# Patient Record
Sex: Male | Born: 1945 | Race: White | Hispanic: No | Marital: Married
Health system: Southern US, Community
[De-identification: ages and names within clinical notes are randomized; demographics above are authoritative.]

---

## 2004-10-06 ENCOUNTER — Emergency Department (HOSPITAL_COMMUNITY): Admission: EM | Admit: 2004-10-06 | Discharge: 2004-10-06 | Payer: Self-pay | Admitting: Emergency Medicine

## 2012-08-12 ENCOUNTER — Emergency Department: Payer: Self-pay | Admitting: Emergency Medicine

## 2012-08-12 LAB — COMPREHENSIVE METABOLIC PANEL
Albumin: 4.3 g/dL (ref 3.4–5.0)
Alkaline Phosphatase: 75 U/L (ref 50–136)
Anion Gap: 7 (ref 7–16)
Bilirubin,Total: 0.8 mg/dL (ref 0.2–1.0)
Chloride: 102 mmol/L (ref 98–107)
Co2: 31 mmol/L (ref 21–32)
Creatinine: 0.98 mg/dL (ref 0.60–1.30)
EGFR (African American): >60
Glucose: 113 mg/dL — ABNORMAL HIGH (ref 65–99)

## 2012-08-12 LAB — CBC
HCT: 47.7 % (ref 40.0–52.0)
HGB: 16 g/dL (ref 13.0–18.0)
MCH: 30.8 pg (ref 26.0–34.0)
MCHC: 33.5 g/dL (ref 32.0–36.0)
Platelet: 194 10*3/uL (ref 150–440)
RBC: 5.2 10*6/uL (ref 4.40–5.90)
RDW: 13.9 % (ref 11.5–14.5)

## 2012-08-12 LAB — URINALYSIS, COMPLETE
Bilirubin,UR: NEGATIVE
Glucose,UR: NEGATIVE mg/dL (ref 0–75)
Protein: NEGATIVE
RBC,UR: 4 /HPF (ref 0–5)
Specific Gravity: 1.026 (ref 1.003–1.030)

## 2012-09-24 ENCOUNTER — Inpatient Hospital Stay: Payer: Self-pay | Admitting: Surgery

## 2012-09-24 LAB — COMPREHENSIVE METABOLIC PANEL
Albumin: 4 g/dL (ref 3.4–5.0)
Anion Gap: 6 — ABNORMAL LOW (ref 7–16)
BUN: 16 mg/dL (ref 7–18)
Bilirubin,Total: 3 mg/dL — ABNORMAL HIGH (ref 0.2–1.0)
Creatinine: 0.84 mg/dL (ref 0.60–1.30)
EGFR (African American): >60
Glucose: 99 mg/dL (ref 65–99)
Osmolality: 279 (ref 275–301)
SGOT(AST): 161 U/L — ABNORMAL HIGH (ref 15–37)
Sodium: 139 mmol/L (ref 136–145)
Total Protein: 7.2 g/dL (ref 6.4–8.2)

## 2012-09-24 LAB — CBC
HCT: 43.7 % (ref 40.0–52.0)
HGB: 14.7 g/dL (ref 12.0–16.0)
MCH: 30.8 pg (ref 26.0–34.0)
MCV: 91 fL (ref 80–100)
Platelet: 154 10*3/uL (ref 150–440)
RDW: 13.6 % (ref 11.5–14.5)

## 2012-09-25 LAB — CBC WITH DIFFERENTIAL/PLATELET
Basophil %: 0.1 %
Eosinophil %: 0 %
HCT: 38.7 % — ABNORMAL LOW (ref 40.0–52.0)
Lymphocyte #: 0.7 10*3/uL — ABNORMAL LOW (ref 1.0–3.6)
Lymphocyte %: 4.9 %
MCHC: 33 g/dL (ref 32.0–36.0)
Monocyte %: 6.5 %
Neutrophil #: 12.3 10*3/uL — ABNORMAL HIGH (ref 1.4–6.5)

## 2012-09-25 LAB — COMPREHENSIVE METABOLIC PANEL
Albumin: 3 g/dL — ABNORMAL LOW (ref 3.4–5.0)
BUN: 17 mg/dL (ref 7–18)
Chloride: 104 mmol/L (ref 98–107)
Co2: 30 mmol/L (ref 21–32)
Creatinine: 1.06 mg/dL (ref 0.60–1.30)
Potassium: 3.3 mmol/L — ABNORMAL LOW (ref 3.5–5.1)
Sodium: 138 mmol/L (ref 136–145)

## 2012-09-26 LAB — CBC WITH DIFFERENTIAL/PLATELET
Basophil #: 0 10*3/uL (ref 0.0–0.1)
Basophil %: 0.3 %
Eosinophil #: 0 10*3/uL (ref 0.0–0.7)
HCT: 38.9 % — ABNORMAL LOW (ref 40.0–52.0)
HGB: 13.1 g/dL (ref 12.0–16.0)
Lymphocyte #: 0.8 10*3/uL — ABNORMAL LOW (ref 1.0–3.6)
MCHC: 33.6 g/dL (ref 32.0–36.0)
MCV: 92 fL (ref 80–100)
Monocyte #: 0.6 10*3/uL (ref 0.2–1.0)
Neutrophil %: 82 %
RBC: 4.24 10*6/uL — ABNORMAL LOW (ref 4.40–5.90)
RDW: 13.8 % (ref 11.5–14.5)

## 2012-09-26 LAB — PATHOLOGY REPORT

## 2014-08-28 DIAGNOSIS — G250 Essential tremor: Secondary | ICD-10-CM | POA: Diagnosis not present

## 2014-08-28 DIAGNOSIS — I10 Essential (primary) hypertension: Secondary | ICD-10-CM | POA: Diagnosis not present

## 2014-08-28 DIAGNOSIS — E039 Hypothyroidism, unspecified: Secondary | ICD-10-CM | POA: Diagnosis not present

## 2014-10-03 NOTE — Discharge Summary (Signed)
PATIENT NAME:  [name] [name], [name] [name] MR#:  [id number] DATE OF BIRTH:  06/29/1945  DATE OF ADMISSION:  09/24/2012 DATE OF DISCHARGE:  09/26/2012  DISCHARGE DIAGNOSES:  1.  Acute cholecystitis and choledocholithiasis.  2.  Hypertension.  3.  Hypothyroidism.   PRINCIPAL PROCEDURES: 1.  Magnetic resonance cholangiopancreatogram which was negative.  2.  Laparoscopic cholecystectomy with intraoperative cholangiography.   HOSPITAL COURSE SUMMARY: The patient was admitted with abdominal pain and signs and symptoms consistent with choledocholithiasis. He had elevated liver function tests. Total bilirubin was 3.0. An MRCP was performed and found to be unremarkable except for a dilated common bile duct at 7.3 mm. The patient had a laparoscopic cholecystectomy with intraoperative cholangiography on April 15th, and he tolerated this well. He had a large stone lodged in the gallbladder neck. Jackson-Pratt drain was left in place postoperatively. On postoperative day 1, the patient was doing well, tolerating a regular diet, had adequate pain control, and no bile was seen in the drain. He was, therefore, discharged home with followup with me on April 24th in the [place] office location. He was discharged with JP in place which will be removed in my office.  DISCHARGE MEDICATIONS: Acetaminophen/oxycodone 5/325, 1 tab every 4 to 6 hours as needed for pain, Synthroid 150 mcg by mouth once a day, hydrochlorothiazide/triamterene 25/37.5 mg tablet by mouth once a day.   DISCHARGE INSTRUCTIONS:  1.  Call with any questions or concerns.   2.  Diet as tolerated.  3.  Discharge instructions were provided to the patient.   ____________________________ Jeannette How. Marina Gravel, MD FACS mab:cb D: 10/09/2012 21:25:56 ET T: 10/09/2012 21:59:00 ET JOB#: [id number] cc: Elta Guadeloupe A. Marina Gravel, MD, <Dictator> Cyndi Bender, MD Pierson Vantol Bettina Gavia MD ELECTRONICALLY SIGNED 10/09/2012 22:17

## 2014-10-03 NOTE — Op Note (Signed)
PATIENT NAME:  [name] [name], [name] [name] MR#:  [id number] DATE OF BIRTH:  1946/03/05  DATE OF PROCEDURE:  09/25/2012  PREOPERATIVE DIAGNOSIS: Cholelithiasis and choledocholithiasis.   POSTOPERATIVE DIAGNOSIS: Cholelithiasis  and choledocholithiasis.    PROCEDURE PERFORMED: Laparoscopic cholecystectomy with intraoperative C-arm fluoroscopy and cholangiography.   SURGEON: Sherri Rad, MD   ASSISTANT: Scrub tech.  ANESTHESIA: General endotracheal.   ESTIMATED BLOOD LOSS: 50 mL.   DRAINS: None.   COUNTS:  LAP and needle count correct x 2.   FINDINGS: The gallbladder appeared to be mildly inflamed and edematous. The majority of the cholangiogram appeared to be unremarkable; however, due to the large nature of the gallbladder and a Kumar cholangiogram catheter being used, the duodenum could not be filled because it was backfilling into the gallbladder.   SPECIMENS: As described above.   DESCRIPTION OF PROCEDURE: With informed consent, supine position, general endotracheal anesthesia, the patient's abdomen was clipped of hair, prepped and draped with ChloraPrep solution. Timeout was observed. A 12 mm blunt Hassan trocar was placed through an infraumbilical transversely oriented skin incision with stay sutures being passed through the fascia. Pneumoperitoneum was established. The patient was then positioned in reverse Trendelenburg and airplaned right side up. Limited laparoscopic evaluation of the abdomen demonstrated normal-appearing stomach and liver. The peritoneum appeared normal. A 5 mm Bladeless trocar was placed in the epigastric region. Two 5-mm first assistant ports were then placed in the right subcostal margin. The gallbladder was aspirated of approximately 30 mL of dark bile. A grasper was placed on the fundus of the gallbladder and elevated towards the right shoulder. Lateral traction was placed on Hartmann's pouch. Dissection of the hepatoduodenal ligament demonstrated a cystic duct, which was  critically identified. The cystic artery with a posterior branch was identified and clipped in the usual fashion and divided. The gallbladder was very redundant in this  area of the Hartmann's pouch. The Kumar clamp was then placed across the base of the Hartmann's pouch. The gallbladder wall was cannulated with the Kumar needle. Fluoroscopy was then achieved. Filling of the cystic duct, common bile duct, right and left hepatic ducts was identified. There were no obvious filling defects.  The gallbladder, however, demonstrated some backfilling. Despite multiple attempts, I could not obtain an adequate seal. Given the fact that an MRCP done that morning was read as normal, I elected to abandon the rest of the cholangiography. The cystic duct was triply clipped on the portal side, singly clipped on the gallbladder side and divided. Further dissection in this area demonstrated no aberrant vessel or bile duct. The gallbladder was then carefully removed from the gallbladder fossa utilizing hook cautery apparatus, captured in an Endo Catch device, and retrieved through an infraumbilical port site. In retrieval of the gallbladder, and 5 mm camera was placed in the epigastric region. There was no evidence of bowel injury from the trocar placement site. With pneumoperitoneum re-established, the right upper quadrant was irrigated with a total of 1 liter of warm normal saline and aspirated dry.  Point hemostasis was obtained with both electrocautery and 10 mL of Surgicel with thrombin application. Ports were then removed under direct visualization. A total of 30 mL of 0.25% plain Marcaine was infiltrated along all skin and fascial incisions prior to closure; 4-0 Vicryl subcuticular was applied to all skin edges, followed by the application of benzoin, Steri-Strips, Telfa, and Tegaderm. The patient was then subsequently extubated and taken to the recovery room in stable and satisfactory condition by Anesthesia services.  ____________________________ Jeannette How Marina Gravel, MD mab:cb D: 09/26/2012 18:50:02 ET T: 09/26/2012 20:29:11 ET JOB#: [id number]  cc: Elta Guadeloupe A. Marina Gravel, MD, <Dictator> Cyndi Bender, MD at [phone] Delailah Spieth A Aldan Camey MD ELECTRONICALLY SIGNED 10/05/2012 9:31

## 2014-10-03 NOTE — H&P (Signed)
PATIENT NAME:  [name], [name] MR#:  [id number] DATE OF BIRTH:  08-May-1946  DATE OF ADMISSION:  09/24/2012  CONSULTING PHYSICIAN: Jerrol Banana. Burt Knack, MD   CHIEF COMPLAINT: Right upper quadrant pain.   HISTORY OF PRESENT ILLNESS: This is a patient who is having his second episode of right upper quadrant pain. It was associated with fatty food intolerance and started this morning after a fatty meal. He noticed diarrhea, then right upper quadrant pain and epigastric pain radiating through to his back with nausea, vomiting and some chills. He states he had shaking chills. Did not take his temperature.   He had an episode like this back in February. He came to the Emergency Room, but his pain abated and he went home without being seen or without having any additional testing, but this pain is not going away and I was asked to see the patient for evaluation. The patient denies jaundice or acholic stools and denies melena or hematochezia. No dysuria.   PAST MEDICAL HISTORY: Hypertension and hypothyroidism.   PAST SURGICAL HISTORY: Multiple inguinal hernia repairs with recurrences.   SOCIAL HISTORY: The patient does not smoke or drink. 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   ALLERGIES: SULFA DRUGS.   MEDICATIONS: Is not listed in chart, will reconcile when available.   FAMILY HISTORY: Noncontributory.   REVIEW OF SYSTEMS: A 10-system review was performed and negative with the exception of that mentioned in the history of present illness.   PHYSICAL EXAMINATION: GENERAL: Healthy, comfortable-appearing Caucasian male patient.   VITAL SIGNS: Temperature 98.5, pulse 84, respirations 20, blood pressure 105/63, pain scale currently 0 and 98% room air sat.  HEENT: No scleral icterus.  NECK: No palpable neck nodes.  CHEST: Clear to auscultation.  CARDIAC: Regular rate and rhythm.   ABDOMEN: Soft. There is a positive Murphy's sign, tender in the right upper quadrant and  epigastrium. Scars in both groins. No obvious hernia.  EXTREMITIES: Without edema.  NEUROLOGIC: Grossly intact.  INTEGUMENT:  Shows no jaundice.   LABORATORY, DIAGNOSTIC AND RADIOLOGIC DATA:  Ultrasound suggests sludge in the gallbladder.   White blood cell count 16.7, hemoglobin and hematocrit 14.7 and 44 and a platelet count of 154. Liver function tests are elevated with a bilirubin 3.0, AST and ALT of 161, 141, respectively, with alkaline phosphatase 69 and a lipase 82. Electrolytes are normal.   ASSESSMENT AND PLAN: This is a patient with likely choledocholithiasis. It is his second episode of right upper quadrant pain with his elevated liver function tests and the suggestion of choledocholithiasis. I have recommended admission to the hospital with hydration, IV antibiotics and re-examination. I discussed with him the repeating of labs in the morning and if his liver function tests are elevated or worsened, then we may consider a preoperative endoscopic cholangiopancreatography, which was described for him, versus a laparoscopic cholecystectomy with cholangiography with the risks of an open common duct exploration, conversion to an open procedure. The risks of bleeding, infection, recurrence of symptoms, failure to resolve the symptoms, conversion to an open procedure, bowel injury or bile duct injury or leak; any of which could require further surgery and/or endoscopic cholangiopancreatography stent and papillotomy were discussed with him. He will be admitted to the hospital, hydrated, IV antibiotics instituted and re-examined and Dr. Marina Gravel will make a decision about laparoscopic cholecystectomy versus preoperative or postoperative endoscopic cholangiopancreatography if necessary. The patient was in agreement with this plan.    ____________________________ Jerrol Banana. Burt Knack, MD rec:cc  D: 09/24/2012 22:00:26 ET T: 09/24/2012 22:51:48 ET JOB#: [id number]  cc: Jerrol Banana. Burt Knack, MD,  <Dictator> Florene Glen MD ELECTRONICALLY SIGNED 09/25/2012 6:38

## 2014-10-03 NOTE — H&P (Signed)
Subjective/Chief Complaint RUQ pain   History of Present Illness second episode RUQ and epig pain started this am after fatty meal nausea, emesis, diarrhea chills no jaundice   Past History HTN, hypothyroid PSH multiple inguinal hernia surgeries   Past Medical Health Hypertension   Past Med/Surgical Hx:  Hypothyroidism:   Hypertension:   ALLERGIES:  Sulfa drugs: Hives  Family and Social History:  Family History Non-Contributory   Social History negative tobacco, negative ETOH, Armed forces technical officer of [employer]   Review of Systems:  Fever/Chills Yes   Cough No   Abdominal Pain Yes   Diarrhea Yes   Constipation No   Nausea/Vomiting Yes   SOB/DOE No   Chest Pain No   Dysuria No   Tolerating Diet No  Nauseated  Vomiting   Physical Exam:  GEN no acute distress   HEENT pink conjunctivae   NECK supple   RESP normal resp effort  no use of accessory muscles   CARD regular rate   ABD positive tenderness  soft  pos Murphy's sign   LYMPH negative neck   EXTR negative edema   SKIN normal to palpation   PSYCH alert, A+O to time, place, person, good insight   Lab Results: Hepatic:  14-Apr-14 17:10   Bilirubin, Total  3.0  Alkaline Phosphatase 69  SGPT (ALT)  141  SGOT (AST)  161  Total Protein, Serum 7.2  Albumin, Serum 4.0  Routine Chem:  14-Apr-14 17:10   Glucose, Serum 99  BUN 16  Creatinine (comp) 0.84  Sodium, Serum 139  Potassium, Serum 3.7  Chloride, Serum 104  CO2, Serum 29  Calcium (Total), Serum 8.8  Osmolality (calc) 279  eGFR (African American) >60  eGFR (Non-African American) >60 (eGFR values <70m/min/1.73 m2 may be an indication of chronic kidney disease (CKD). Calculated eGFR is useful in patients with stable renal function. The eGFR calculation will not be reliable in acutely ill patients when serum creatinine is changing rapidly. It is not useful in  patients on dialysis. The eGFR calculation may not be  applicable to patients at the low and high extremes of body sizes, pregnant women, and vegetarians.)  Anion Gap  6  Result Comment hem/platelet - SMEAR SCANNED  Result(s) reported on 24 Sep 2012 at 05:55PM.  Lipase 82 (Result(s) reported on 24 Sep 2012 at 05:43PM.)  Routine Hem:  14-Apr-14 17:10   WBC (CBC)  16.7  RBC (CBC) 4.78  Hemoglobin (CBC) 14.7  Hematocrit (CBC) 43.7  Platelet Count (CBC) 154  MCV 91  MCH 30.8  MCHC 33.7  RDW 13.6   Radiology Results: [place]    14-Apr-14 19:33, [place]  [place] Survey  REASON FOR EXAM:    RUQ pain  COMMENTS:   Body Site: GB and Fossa, CBD, Head of Pancreas    PROCEDURE: [place] - [place] LIMITED SURVEY  - Sep 24 2012  7:33PM     RESULT: Right or quadrant abdominal ultrasound dated 09/24/2012.    Findings: The liver demonstrates a homogeneous echotexture. Hepato- pedal   flow is identified within the portal vein. There is no evidence of   ascites. Visualized portion of the pancreas unremarkable.    Dependent echogenic sludge versus very small mobile gallstones identified   within the gallbladder. The gallbladder wall measures 1.6 mm in   thickness. The common bile duct measures 7.6 mm in diameter. A positive   sonographic Murphy's sign is elicited. There is no  evidence of     pericholecystic fluid.    IMPRESSION:  Equivocal findings for cholecystitis. The early or mild   cholecystitis cannot be excluded alternatively passage of a calculus   through the system is also of diagnostic consideration. Clinical   correlation recommended and possibly surgical consultation if clinically   warranted.        Verified By: Mikki Santee, M.D., MD    Assessment/Admission Diagnosis choledocholithiasis rec admit hydrate IV abx recheck LFT LC with grams once able; may need ERCP risks options in detail see dictation   Electronic Signatures: Florene Glen (MD)  (Signed 14-Apr-14 21:54)  Authored: CHIEF  COMPLAINT and HISTORY, PAST MEDICAL/SURGIAL HISTORY, ALLERGIES, FAMILY AND SOCIAL HISTORY, REVIEW OF SYSTEMS, PHYSICAL EXAM, LABS, Radiology, ASSESSMENT AND PLAN   Last Updated: 14-Apr-14 21:54 by Florene Glen (MD)

## 2014-12-10 DIAGNOSIS — Z9181 History of falling: Secondary | ICD-10-CM | POA: Diagnosis not present

## 2014-12-10 DIAGNOSIS — D229 Melanocytic nevi, unspecified: Secondary | ICD-10-CM | POA: Diagnosis not present

## 2014-12-10 DIAGNOSIS — Z139 Encounter for screening, unspecified: Secondary | ICD-10-CM | POA: Diagnosis not present

## 2014-12-10 DIAGNOSIS — D1809 Hemangioma of other sites: Secondary | ICD-10-CM | POA: Diagnosis not present

## 2014-12-10 DIAGNOSIS — Z1389 Encounter for screening for other disorder: Secondary | ICD-10-CM | POA: Diagnosis not present

## 2014-12-10 DIAGNOSIS — D235 Other benign neoplasm of skin of trunk: Secondary | ICD-10-CM | POA: Diagnosis not present

## 2015-01-05 DIAGNOSIS — H5210 Myopia, unspecified eye: Secondary | ICD-10-CM | POA: Diagnosis not present

## 2015-01-05 DIAGNOSIS — H524 Presbyopia: Secondary | ICD-10-CM | POA: Diagnosis not present

## 2015-03-04 DIAGNOSIS — I10 Essential (primary) hypertension: Secondary | ICD-10-CM | POA: Diagnosis not present

## 2015-03-04 DIAGNOSIS — E039 Hypothyroidism, unspecified: Secondary | ICD-10-CM | POA: Diagnosis not present

## 2015-03-04 DIAGNOSIS — G250 Essential tremor: Secondary | ICD-10-CM | POA: Diagnosis not present

## 2015-03-04 DIAGNOSIS — Z1389 Encounter for screening for other disorder: Secondary | ICD-10-CM | POA: Diagnosis not present

## 2015-03-04 DIAGNOSIS — Z125 Encounter for screening for malignant neoplasm of prostate: Secondary | ICD-10-CM | POA: Diagnosis not present

## 2015-03-04 DIAGNOSIS — Z6826 Body mass index (BMI) 26.0-26.9, adult: Secondary | ICD-10-CM | POA: Diagnosis not present

## 2015-03-04 DIAGNOSIS — Z0000 Encounter for general adult medical examination without abnormal findings: Secondary | ICD-10-CM | POA: Diagnosis not present

## 2015-09-01 DIAGNOSIS — R001 Bradycardia, unspecified: Secondary | ICD-10-CM | POA: Diagnosis not present

## 2015-09-01 DIAGNOSIS — M170 Bilateral primary osteoarthritis of knee: Secondary | ICD-10-CM | POA: Diagnosis not present

## 2015-09-01 DIAGNOSIS — E039 Hypothyroidism, unspecified: Secondary | ICD-10-CM | POA: Diagnosis not present

## 2015-09-01 DIAGNOSIS — I10 Essential (primary) hypertension: Secondary | ICD-10-CM | POA: Diagnosis not present

## 2015-09-01 DIAGNOSIS — Z6827 Body mass index (BMI) 27.0-27.9, adult: Secondary | ICD-10-CM | POA: Diagnosis not present

## 2015-10-07 DIAGNOSIS — S30871A Other superficial bite of abdominal wall, initial encounter: Secondary | ICD-10-CM | POA: Diagnosis not present

## 2015-11-04 DIAGNOSIS — Z1211 Encounter for screening for malignant neoplasm of colon: Secondary | ICD-10-CM | POA: Diagnosis not present

## 2016-01-25 DIAGNOSIS — H524 Presbyopia: Secondary | ICD-10-CM | POA: Diagnosis not present

## 2016-01-25 DIAGNOSIS — H5210 Myopia, unspecified eye: Secondary | ICD-10-CM | POA: Diagnosis not present

## 2016-03-04 DIAGNOSIS — G250 Essential tremor: Secondary | ICD-10-CM | POA: Diagnosis not present

## 2016-03-04 DIAGNOSIS — Z6827 Body mass index (BMI) 27.0-27.9, adult: Secondary | ICD-10-CM | POA: Diagnosis not present

## 2016-03-04 DIAGNOSIS — E039 Hypothyroidism, unspecified: Secondary | ICD-10-CM | POA: Diagnosis not present

## 2016-03-04 DIAGNOSIS — Z125 Encounter for screening for malignant neoplasm of prostate: Secondary | ICD-10-CM | POA: Diagnosis not present

## 2016-03-04 DIAGNOSIS — I10 Essential (primary) hypertension: Secondary | ICD-10-CM | POA: Diagnosis not present

## 2016-03-04 DIAGNOSIS — E663 Overweight: Secondary | ICD-10-CM | POA: Diagnosis not present

## 2016-03-04 DIAGNOSIS — M170 Bilateral primary osteoarthritis of knee: Secondary | ICD-10-CM | POA: Diagnosis not present

## 2016-03-04 DIAGNOSIS — Z9181 History of falling: Secondary | ICD-10-CM | POA: Diagnosis not present

## 2016-03-04 DIAGNOSIS — Z0000 Encounter for general adult medical examination without abnormal findings: Secondary | ICD-10-CM | POA: Diagnosis not present

## 2016-09-01 DIAGNOSIS — Z1389 Encounter for screening for other disorder: Secondary | ICD-10-CM | POA: Diagnosis not present

## 2016-09-01 DIAGNOSIS — G250 Essential tremor: Secondary | ICD-10-CM | POA: Diagnosis not present

## 2016-09-01 DIAGNOSIS — E039 Hypothyroidism, unspecified: Secondary | ICD-10-CM | POA: Diagnosis not present

## 2016-09-01 DIAGNOSIS — I10 Essential (primary) hypertension: Secondary | ICD-10-CM | POA: Diagnosis not present

## 2016-09-01 DIAGNOSIS — Z6827 Body mass index (BMI) 27.0-27.9, adult: Secondary | ICD-10-CM | POA: Diagnosis not present

## 2016-10-11 DIAGNOSIS — E039 Hypothyroidism, unspecified: Secondary | ICD-10-CM | POA: Diagnosis not present

## 2017-01-30 DIAGNOSIS — H2513 Age-related nuclear cataract, bilateral: Secondary | ICD-10-CM | POA: Diagnosis not present

## 2017-01-30 DIAGNOSIS — H35362 Drusen (degenerative) of macula, left eye: Secondary | ICD-10-CM | POA: Diagnosis not present

## 2017-01-30 DIAGNOSIS — H524 Presbyopia: Secondary | ICD-10-CM | POA: Diagnosis not present

## 2017-01-30 DIAGNOSIS — H43813 Vitreous degeneration, bilateral: Secondary | ICD-10-CM | POA: Diagnosis not present

## 2017-02-15 DIAGNOSIS — M25562 Pain in left knee: Secondary | ICD-10-CM | POA: Diagnosis not present

## 2017-02-15 DIAGNOSIS — Z6826 Body mass index (BMI) 26.0-26.9, adult: Secondary | ICD-10-CM | POA: Diagnosis not present

## 2017-03-09 DIAGNOSIS — Z139 Encounter for screening, unspecified: Secondary | ICD-10-CM | POA: Diagnosis not present

## 2017-03-09 DIAGNOSIS — E039 Hypothyroidism, unspecified: Secondary | ICD-10-CM | POA: Diagnosis not present

## 2017-03-09 DIAGNOSIS — Z9181 History of falling: Secondary | ICD-10-CM | POA: Diagnosis not present

## 2017-03-09 DIAGNOSIS — Z125 Encounter for screening for malignant neoplasm of prostate: Secondary | ICD-10-CM | POA: Diagnosis not present

## 2017-03-09 DIAGNOSIS — Z0000 Encounter for general adult medical examination without abnormal findings: Secondary | ICD-10-CM | POA: Diagnosis not present

## 2017-03-09 DIAGNOSIS — Z23 Encounter for immunization: Secondary | ICD-10-CM | POA: Diagnosis not present

## 2017-03-09 DIAGNOSIS — I10 Essential (primary) hypertension: Secondary | ICD-10-CM | POA: Diagnosis not present

## 2017-03-09 DIAGNOSIS — E001 Congenital iodine-deficiency syndrome, myxedematous type: Secondary | ICD-10-CM | POA: Diagnosis not present

## 2017-03-09 DIAGNOSIS — Z1389 Encounter for screening for other disorder: Secondary | ICD-10-CM | POA: Diagnosis not present

## 2017-07-17 DIAGNOSIS — Z6827 Body mass index (BMI) 27.0-27.9, adult: Secondary | ICD-10-CM | POA: Diagnosis not present

## 2017-07-17 DIAGNOSIS — M5416 Radiculopathy, lumbar region: Secondary | ICD-10-CM | POA: Diagnosis not present

## 2017-09-06 DIAGNOSIS — Z6827 Body mass index (BMI) 27.0-27.9, adult: Secondary | ICD-10-CM | POA: Diagnosis not present

## 2017-09-06 DIAGNOSIS — Z1331 Encounter for screening for depression: Secondary | ICD-10-CM | POA: Diagnosis not present

## 2017-09-06 DIAGNOSIS — G250 Essential tremor: Secondary | ICD-10-CM | POA: Diagnosis not present

## 2017-09-06 DIAGNOSIS — M5416 Radiculopathy, lumbar region: Secondary | ICD-10-CM | POA: Diagnosis not present

## 2017-09-06 DIAGNOSIS — I10 Essential (primary) hypertension: Secondary | ICD-10-CM | POA: Diagnosis not present

## 2017-09-06 DIAGNOSIS — E039 Hypothyroidism, unspecified: Secondary | ICD-10-CM | POA: Diagnosis not present

## 2018-01-16 DIAGNOSIS — Z6826 Body mass index (BMI) 26.0-26.9, adult: Secondary | ICD-10-CM | POA: Diagnosis not present

## 2018-01-16 DIAGNOSIS — M170 Bilateral primary osteoarthritis of knee: Secondary | ICD-10-CM | POA: Diagnosis not present

## 2018-01-17 DIAGNOSIS — H35363 Drusen (degenerative) of macula, bilateral: Secondary | ICD-10-CM | POA: Diagnosis not present

## 2018-01-17 DIAGNOSIS — H524 Presbyopia: Secondary | ICD-10-CM | POA: Diagnosis not present

## 2018-01-17 DIAGNOSIS — H2513 Age-related nuclear cataract, bilateral: Secondary | ICD-10-CM | POA: Diagnosis not present

## 2018-01-17 DIAGNOSIS — H43813 Vitreous degeneration, bilateral: Secondary | ICD-10-CM | POA: Diagnosis not present

## 2018-02-02 DIAGNOSIS — M1712 Unilateral primary osteoarthritis, left knee: Secondary | ICD-10-CM | POA: Diagnosis not present

## 2018-03-09 DIAGNOSIS — Z125 Encounter for screening for malignant neoplasm of prostate: Secondary | ICD-10-CM | POA: Diagnosis not present

## 2018-03-09 DIAGNOSIS — E039 Hypothyroidism, unspecified: Secondary | ICD-10-CM | POA: Diagnosis not present

## 2018-03-09 DIAGNOSIS — M170 Bilateral primary osteoarthritis of knee: Secondary | ICD-10-CM | POA: Diagnosis not present

## 2018-03-09 DIAGNOSIS — Z23 Encounter for immunization: Secondary | ICD-10-CM | POA: Diagnosis not present

## 2018-03-09 DIAGNOSIS — Z1339 Encounter for screening examination for other mental health and behavioral disorders: Secondary | ICD-10-CM | POA: Diagnosis not present

## 2018-03-09 DIAGNOSIS — I10 Essential (primary) hypertension: Secondary | ICD-10-CM | POA: Diagnosis not present

## 2018-03-09 DIAGNOSIS — G250 Essential tremor: Secondary | ICD-10-CM | POA: Diagnosis not present

## 2018-03-15 DIAGNOSIS — Z139 Encounter for screening, unspecified: Secondary | ICD-10-CM | POA: Diagnosis not present

## 2018-03-15 DIAGNOSIS — Z0000 Encounter for general adult medical examination without abnormal findings: Secondary | ICD-10-CM | POA: Diagnosis not present

## 2018-03-15 DIAGNOSIS — Z125 Encounter for screening for malignant neoplasm of prostate: Secondary | ICD-10-CM | POA: Diagnosis not present

## 2018-03-15 DIAGNOSIS — Z1339 Encounter for screening examination for other mental health and behavioral disorders: Secondary | ICD-10-CM | POA: Diagnosis not present

## 2018-03-15 DIAGNOSIS — Z9181 History of falling: Secondary | ICD-10-CM | POA: Diagnosis not present

## 2018-03-26 DIAGNOSIS — M791 Myalgia, unspecified site: Secondary | ICD-10-CM | POA: Diagnosis not present

## 2018-04-02 DIAGNOSIS — R29898 Other symptoms and signs involving the musculoskeletal system: Secondary | ICD-10-CM | POA: Diagnosis not present

## 2018-04-02 DIAGNOSIS — M791 Myalgia, unspecified site: Secondary | ICD-10-CM | POA: Diagnosis not present

## 2018-04-02 DIAGNOSIS — E611 Iron deficiency: Secondary | ICD-10-CM | POA: Diagnosis not present

## 2018-04-02 DIAGNOSIS — E538 Deficiency of other specified B group vitamins: Secondary | ICD-10-CM | POA: Diagnosis not present

## 2018-04-02 DIAGNOSIS — E559 Vitamin D deficiency, unspecified: Secondary | ICD-10-CM | POA: Diagnosis not present

## 2018-04-04 DIAGNOSIS — R29898 Other symptoms and signs involving the musculoskeletal system: Secondary | ICD-10-CM | POA: Diagnosis not present

## 2018-04-12 DIAGNOSIS — R29898 Other symptoms and signs involving the musculoskeletal system: Secondary | ICD-10-CM | POA: Diagnosis not present

## 2018-04-12 DIAGNOSIS — G629 Polyneuropathy, unspecified: Secondary | ICD-10-CM | POA: Diagnosis not present

## 2018-05-15 DIAGNOSIS — R200 Anesthesia of skin: Secondary | ICD-10-CM | POA: Diagnosis not present

## 2018-06-29 DIAGNOSIS — R29898 Other symptoms and signs involving the musculoskeletal system: Secondary | ICD-10-CM | POA: Diagnosis not present

## 2018-06-29 DIAGNOSIS — M47815 Spondylosis without myelopathy or radiculopathy, thoracolumbar region: Secondary | ICD-10-CM | POA: Diagnosis not present

## 2018-06-29 DIAGNOSIS — M545 Low back pain: Secondary | ICD-10-CM | POA: Diagnosis not present

## 2018-06-29 DIAGNOSIS — R200 Anesthesia of skin: Secondary | ICD-10-CM | POA: Diagnosis not present

## 2018-06-29 DIAGNOSIS — M48061 Spinal stenosis, lumbar region without neurogenic claudication: Secondary | ICD-10-CM | POA: Diagnosis not present

## 2018-08-07 ENCOUNTER — Ambulatory Visit: Payer: Medicare HMO | Attending: Neurology | Admitting: Physical Therapy

## 2018-08-07 ENCOUNTER — Other Ambulatory Visit: Payer: Self-pay

## 2018-08-07 ENCOUNTER — Encounter: Payer: Self-pay | Admitting: Physical Therapy

## 2018-08-07 DIAGNOSIS — M5416 Radiculopathy, lumbar region: Secondary | ICD-10-CM | POA: Diagnosis not present

## 2018-08-07 DIAGNOSIS — M5440 Lumbago with sciatica, unspecified side: Secondary | ICD-10-CM | POA: Insufficient documentation

## 2018-08-07 NOTE — Therapy (Addendum)
[place] MAIN [facility] SERVICES [address] [place], [place], [address] Phone: [phone]   Fax:  [phone]  Physical Therapy Evaluation  Patient Details  Name: Liberty Stead. MRN: [id number] Date of Birth: 05/31/1946 Referring Provider (PT): Anabel Bene   Encounter Date: 08/07/2018  PT End of Session - 08/07/18 3254    Visit Number  1    Number of Visits  1    Date for PT Re-Evaluation  08/07/18    PT Start Time  0945    PT Stop Time  1030    PT Time Calculation (min)  45 min    Activity Tolerance  Patient tolerated treatment well;No increased pain    Behavior During Therapy  [facility] for tasks assessed/performed       History reviewed. No pertinent past medical history.  History reviewed. No pertinent surgical history.  There were no vitals filed for this visit.   Subjective Assessment - 08/07/18 0950    Subjective  Patient was having back pain 06/26/18 . He had an x-ray. Now he is doing fine.     Pertinent History  Patient was having pain in his low back and it was going down his legs bilaterally. His back was sore and now it has been better . He is taking ibuprofin 2 x day.     Patient Stated Goals  He wants to be able to do yoga and exercises.    Currently in Pain?  No/denies    Multiple Pain Sites  No         [facility] PT Assessment - 08/07/18 0953      Assessment   Medical Diagnosis  low back pain    Referring Provider (PT)  Gurney Maxin E    Onset Date/Surgical Date  06/26/18    Hand Dominance  Right    Prior Therapy  no      Precautions   Precautions  None      Restrictions   Weight Bearing Restrictions  No      Balance Screen   Has the patient fallen in the past 6 months  No    Has the patient had a decrease in activity level because of a fear of falling?   No    Is the patient reluctant to leave their home because of a fear of falling?   No      Home Social worker  Private  residence    Living Arrangements  Spouse/significant other    Available Help at Discharge  Family    Type of [place] to enter    Entrance Stairs-Number of Steps  [place]  One level      Prior Function   Level of Independence  Independent;Independent with basic ADLs;Independent with household mobility without device    Vocation  Retired    Leisure  exercise, remodelying house      Cognition   Overall Cognitive Status  Within Functional Limits for tasks assessed        PAIN: Patient has no pain in low back   POSTURE: WNL   PROM/AROM :  WFL BLE and BUE  Strength; BLE is WFL   SENSATION: WNL   SPECIAL TESTS: negative for SLR bilaterally Negative for FABER left and right    FUNCTIONAL MOBILITY: independent    BALANCE:WNL  GAIT: independent without AD  Treatment: Reviewed patients HEP  Added;  Hooklying with TA : marching, hip abdER with BTB, knee to chest Stretching: BLE knee to chest, single LE knee to chest hoklying trunk rotation left and right Standing with TA and trunk rotation left and right  Stretching; hamstrings, gastro/ soleus            Objective measurements completed on examination: See above findings.              PT Education - 08/07/18 0958    Education Details  plan of care    Person(s) Educated  Patient    Methods  Explanation    Comprehension  Verbalized understanding       PT Short Term Goals - 08/07/18 1050      PT SHORT TERM GOAL #1   Title  Patient will be independent with HEP for strengthening  of low back and flexibility      Time  1    Period  Weeks    Status  Achieved    Target Date  08/07/18                Plan - 08/07/18 1042    Clinical Impression Statement  Patient presents to PT evaluation for LBP. His pain has resolved and he is doing a HEP that he found on the internet. He has negative SLR test  bilaterally,  negative FABER BLE . He has dereased BLE hip ER. Patient was educated and instructed in HEP for stetching and strengthening of low back. He had no reports of pain during eval or treatment. He will not be needing further skilled PT and will be discharged to HEP.    Clinical Presentation  Stable    Clinical Decision Making  Low    Rehab Potential  Excellent    PT Frequency  One time visit    PT Treatment/Interventions  Therapeutic exercise    PT Home Exercise Plan  hooklying TA: marching, hip abd/ER with BTB, knee to chest, prone extension, seated cat/camell, hip flex knee to chest stretch, B knee to chest stretch    Consulted and Agree with Plan of Care  Patient       Patient will benefit from skilled therapeutic intervention in order to improve the following deficits and impairments:  Impaired flexibility, Decreased strength  Visit Diagnosis: Bilateral low back pain with sciatica, sciatica laterality unspecified, unspecified chronicity  Radiculopathy, lumbar region  M 54. 16   Problem List There are no active problems to display for this patient.   [address] , [place] DPT 08/07/2018, 11:00 AM  [place] MAIN [facility] SERVICES [address] [place], [place], [address] Phone: [phone]   Fax:  [phone]  Name: Curties Conigliaro. MRN: [id number] Date of Birth: [birth year]

## 2018-08-09 ENCOUNTER — Ambulatory Visit: Payer: Medicare HMO | Admitting: Physical Therapy

## 2018-08-13 ENCOUNTER — Encounter: Payer: Medicare HMO | Admitting: Physical Therapy

## 2018-08-14 ENCOUNTER — Ambulatory Visit: Payer: Medicare HMO

## 2018-08-21 ENCOUNTER — Encounter: Payer: Medicare HMO | Admitting: Physical Therapy

## 2018-08-23 ENCOUNTER — Encounter: Payer: Medicare HMO | Admitting: Physical Therapy

## 2018-08-27 DIAGNOSIS — I10 Essential (primary) hypertension: Secondary | ICD-10-CM | POA: Diagnosis not present

## 2018-08-27 DIAGNOSIS — Z79899 Other long term (current) drug therapy: Secondary | ICD-10-CM | POA: Diagnosis not present

## 2018-08-27 DIAGNOSIS — E039 Hypothyroidism, unspecified: Secondary | ICD-10-CM | POA: Diagnosis not present

## 2018-08-28 ENCOUNTER — Encounter: Payer: Medicare HMO | Admitting: Physical Therapy

## 2018-08-28 DIAGNOSIS — E039 Hypothyroidism, unspecified: Secondary | ICD-10-CM | POA: Diagnosis not present

## 2018-08-28 DIAGNOSIS — Z6826 Body mass index (BMI) 26.0-26.9, adult: Secondary | ICD-10-CM | POA: Diagnosis not present

## 2018-08-28 DIAGNOSIS — M170 Bilateral primary osteoarthritis of knee: Secondary | ICD-10-CM | POA: Diagnosis not present

## 2018-08-28 DIAGNOSIS — I10 Essential (primary) hypertension: Secondary | ICD-10-CM | POA: Diagnosis not present

## 2018-08-28 DIAGNOSIS — E663 Overweight: Secondary | ICD-10-CM | POA: Diagnosis not present

## 2018-08-30 ENCOUNTER — Encounter: Payer: Medicare HMO | Admitting: Physical Therapy

## 2018-09-04 ENCOUNTER — Encounter: Payer: Medicare HMO | Admitting: Physical Therapy

## 2018-09-06 ENCOUNTER — Encounter: Payer: Medicare HMO | Admitting: Physical Therapy

## 2018-09-11 ENCOUNTER — Encounter: Payer: Medicare HMO | Admitting: Physical Therapy

## 2018-09-13 ENCOUNTER — Encounter: Payer: Medicare HMO | Admitting: Physical Therapy

## 2018-09-18 ENCOUNTER — Encounter: Payer: Medicare HMO | Admitting: Physical Therapy

## 2018-09-20 ENCOUNTER — Encounter: Payer: Medicare HMO | Admitting: Physical Therapy

## 2018-09-25 ENCOUNTER — Encounter: Payer: Medicare HMO | Admitting: Physical Therapy

## 2018-09-27 ENCOUNTER — Encounter: Payer: Medicare HMO | Admitting: Physical Therapy

## 2018-10-02 DIAGNOSIS — G629 Polyneuropathy, unspecified: Secondary | ICD-10-CM | POA: Diagnosis not present

## 2019-02-19 DIAGNOSIS — H524 Presbyopia: Secondary | ICD-10-CM | POA: Diagnosis not present

## 2019-02-19 DIAGNOSIS — H35363 Drusen (degenerative) of macula, bilateral: Secondary | ICD-10-CM | POA: Diagnosis not present

## 2019-02-19 DIAGNOSIS — H25811 Combined forms of age-related cataract, right eye: Secondary | ICD-10-CM | POA: Diagnosis not present

## 2019-02-19 DIAGNOSIS — H25812 Combined forms of age-related cataract, left eye: Secondary | ICD-10-CM | POA: Diagnosis not present

## 2019-03-04 DIAGNOSIS — Z125 Encounter for screening for malignant neoplasm of prostate: Secondary | ICD-10-CM | POA: Diagnosis not present

## 2019-03-04 DIAGNOSIS — E039 Hypothyroidism, unspecified: Secondary | ICD-10-CM | POA: Diagnosis not present

## 2019-03-04 DIAGNOSIS — Z6827 Body mass index (BMI) 27.0-27.9, adult: Secondary | ICD-10-CM | POA: Diagnosis not present

## 2019-03-04 DIAGNOSIS — Z23 Encounter for immunization: Secondary | ICD-10-CM | POA: Diagnosis not present

## 2019-03-04 DIAGNOSIS — Z79899 Other long term (current) drug therapy: Secondary | ICD-10-CM | POA: Diagnosis not present

## 2019-03-04 DIAGNOSIS — Z1331 Encounter for screening for depression: Secondary | ICD-10-CM | POA: Diagnosis not present

## 2019-03-04 DIAGNOSIS — I10 Essential (primary) hypertension: Secondary | ICD-10-CM | POA: Diagnosis not present

## 2019-03-12 DIAGNOSIS — H18413 Arcus senilis, bilateral: Secondary | ICD-10-CM | POA: Diagnosis not present

## 2019-03-12 DIAGNOSIS — H25013 Cortical age-related cataract, bilateral: Secondary | ICD-10-CM | POA: Diagnosis not present

## 2019-03-12 DIAGNOSIS — H2513 Age-related nuclear cataract, bilateral: Secondary | ICD-10-CM | POA: Diagnosis not present

## 2019-03-12 DIAGNOSIS — H353131 Nonexudative age-related macular degeneration, bilateral, early dry stage: Secondary | ICD-10-CM | POA: Diagnosis not present

## 2019-03-12 DIAGNOSIS — H25043 Posterior subcapsular polar age-related cataract, bilateral: Secondary | ICD-10-CM | POA: Diagnosis not present

## 2019-03-18 DIAGNOSIS — Z1331 Encounter for screening for depression: Secondary | ICD-10-CM | POA: Diagnosis not present

## 2019-03-18 DIAGNOSIS — M5432 Sciatica, left side: Secondary | ICD-10-CM | POA: Diagnosis not present

## 2019-03-18 DIAGNOSIS — Z0000 Encounter for general adult medical examination without abnormal findings: Secondary | ICD-10-CM | POA: Diagnosis not present

## 2019-03-18 DIAGNOSIS — M79605 Pain in left leg: Secondary | ICD-10-CM | POA: Diagnosis not present

## 2019-03-18 DIAGNOSIS — Z9181 History of falling: Secondary | ICD-10-CM | POA: Diagnosis not present

## 2019-03-18 DIAGNOSIS — M545 Low back pain: Secondary | ICD-10-CM | POA: Diagnosis not present

## 2019-03-18 DIAGNOSIS — Z125 Encounter for screening for malignant neoplasm of prostate: Secondary | ICD-10-CM | POA: Diagnosis not present

## 2019-03-25 DIAGNOSIS — H2512 Age-related nuclear cataract, left eye: Secondary | ICD-10-CM | POA: Diagnosis not present

## 2019-03-26 DIAGNOSIS — H2511 Age-related nuclear cataract, right eye: Secondary | ICD-10-CM | POA: Diagnosis not present

## 2019-04-22 DIAGNOSIS — H2511 Age-related nuclear cataract, right eye: Secondary | ICD-10-CM | POA: Diagnosis not present

## 2019-06-11 ENCOUNTER — Other Ambulatory Visit: Payer: Self-pay | Admitting: Physical Medicine and Rehabilitation

## 2019-06-11 ENCOUNTER — Other Ambulatory Visit (HOSPITAL_COMMUNITY): Payer: Self-pay | Admitting: Physical Medicine and Rehabilitation

## 2019-06-11 DIAGNOSIS — M5442 Lumbago with sciatica, left side: Secondary | ICD-10-CM | POA: Diagnosis not present

## 2019-06-11 DIAGNOSIS — M5416 Radiculopathy, lumbar region: Secondary | ICD-10-CM

## 2019-06-11 DIAGNOSIS — G8929 Other chronic pain: Secondary | ICD-10-CM | POA: Diagnosis not present

## 2019-06-21 ENCOUNTER — Other Ambulatory Visit: Payer: Self-pay

## 2019-06-21 ENCOUNTER — Ambulatory Visit
Admission: RE | Admit: 2019-06-21 | Discharge: 2019-06-21 | Disposition: A | Discharge status: Home / Self Care | Payer: Medicare HMO | Source: Ambulatory Visit | Attending: Physical Medicine and Rehabilitation | Admitting: Physical Medicine and Rehabilitation

## 2019-06-21 DIAGNOSIS — M5416 Radiculopathy, lumbar region: Secondary | ICD-10-CM | POA: Diagnosis not present

## 2019-06-21 DIAGNOSIS — M545 Low back pain: Secondary | ICD-10-CM | POA: Diagnosis not present

## 2019-06-26 DIAGNOSIS — M5416 Radiculopathy, lumbar region: Secondary | ICD-10-CM | POA: Diagnosis not present

## 2019-06-26 DIAGNOSIS — M5136 Other intervertebral disc degeneration, lumbar region: Secondary | ICD-10-CM | POA: Diagnosis not present

## 2019-07-03 DIAGNOSIS — M5136 Other intervertebral disc degeneration, lumbar region: Secondary | ICD-10-CM | POA: Diagnosis not present

## 2019-07-03 DIAGNOSIS — M5416 Radiculopathy, lumbar region: Secondary | ICD-10-CM | POA: Diagnosis not present

## 2019-07-03 DIAGNOSIS — M545 Low back pain: Secondary | ICD-10-CM | POA: Diagnosis not present

## 2019-07-08 DIAGNOSIS — M545 Low back pain: Secondary | ICD-10-CM | POA: Diagnosis not present

## 2019-07-08 DIAGNOSIS — M5416 Radiculopathy, lumbar region: Secondary | ICD-10-CM | POA: Diagnosis not present

## 2019-07-08 DIAGNOSIS — M5136 Other intervertebral disc degeneration, lumbar region: Secondary | ICD-10-CM | POA: Diagnosis not present

## 2019-07-11 DIAGNOSIS — M545 Low back pain: Secondary | ICD-10-CM | POA: Diagnosis not present

## 2019-07-11 DIAGNOSIS — M5136 Other intervertebral disc degeneration, lumbar region: Secondary | ICD-10-CM | POA: Diagnosis not present

## 2019-07-11 DIAGNOSIS — M5416 Radiculopathy, lumbar region: Secondary | ICD-10-CM | POA: Diagnosis not present

## 2019-07-15 DIAGNOSIS — M5136 Other intervertebral disc degeneration, lumbar region: Secondary | ICD-10-CM | POA: Diagnosis not present

## 2019-07-15 DIAGNOSIS — M545 Low back pain: Secondary | ICD-10-CM | POA: Diagnosis not present

## 2019-07-15 DIAGNOSIS — M5416 Radiculopathy, lumbar region: Secondary | ICD-10-CM | POA: Diagnosis not present

## 2019-07-18 DIAGNOSIS — M5416 Radiculopathy, lumbar region: Secondary | ICD-10-CM | POA: Diagnosis not present

## 2019-07-18 DIAGNOSIS — M545 Low back pain: Secondary | ICD-10-CM | POA: Diagnosis not present

## 2019-07-18 DIAGNOSIS — M5136 Other intervertebral disc degeneration, lumbar region: Secondary | ICD-10-CM | POA: Diagnosis not present

## 2019-07-25 DIAGNOSIS — M545 Low back pain: Secondary | ICD-10-CM | POA: Diagnosis not present

## 2019-07-25 DIAGNOSIS — M5416 Radiculopathy, lumbar region: Secondary | ICD-10-CM | POA: Diagnosis not present

## 2019-07-25 DIAGNOSIS — M5136 Other intervertebral disc degeneration, lumbar region: Secondary | ICD-10-CM | POA: Diagnosis not present

## 2019-07-31 DIAGNOSIS — M545 Low back pain: Secondary | ICD-10-CM | POA: Diagnosis not present

## 2019-07-31 DIAGNOSIS — M5416 Radiculopathy, lumbar region: Secondary | ICD-10-CM | POA: Diagnosis not present

## 2019-07-31 DIAGNOSIS — M5136 Other intervertebral disc degeneration, lumbar region: Secondary | ICD-10-CM | POA: Diagnosis not present

## 2019-09-02 DIAGNOSIS — Z79899 Other long term (current) drug therapy: Secondary | ICD-10-CM | POA: Diagnosis not present

## 2019-09-02 DIAGNOSIS — Z139 Encounter for screening, unspecified: Secondary | ICD-10-CM | POA: Diagnosis not present

## 2019-09-02 DIAGNOSIS — E039 Hypothyroidism, unspecified: Secondary | ICD-10-CM | POA: Diagnosis not present

## 2019-09-02 DIAGNOSIS — Z6826 Body mass index (BMI) 26.0-26.9, adult: Secondary | ICD-10-CM | POA: Diagnosis not present

## 2019-09-02 DIAGNOSIS — M170 Bilateral primary osteoarthritis of knee: Secondary | ICD-10-CM | POA: Diagnosis not present

## 2019-09-02 DIAGNOSIS — I10 Essential (primary) hypertension: Secondary | ICD-10-CM | POA: Diagnosis not present

## 2019-09-10 DIAGNOSIS — M6281 Muscle weakness (generalized): Secondary | ICD-10-CM | POA: Diagnosis not present

## 2019-09-10 DIAGNOSIS — M2560 Stiffness of unspecified joint, not elsewhere classified: Secondary | ICD-10-CM | POA: Diagnosis not present

## 2019-09-10 DIAGNOSIS — M5416 Radiculopathy, lumbar region: Secondary | ICD-10-CM | POA: Diagnosis not present

## 2019-09-16 DIAGNOSIS — M6281 Muscle weakness (generalized): Secondary | ICD-10-CM | POA: Diagnosis not present

## 2019-09-16 DIAGNOSIS — M2560 Stiffness of unspecified joint, not elsewhere classified: Secondary | ICD-10-CM | POA: Diagnosis not present

## 2019-09-16 DIAGNOSIS — M5416 Radiculopathy, lumbar region: Secondary | ICD-10-CM | POA: Diagnosis not present

## 2019-09-20 DIAGNOSIS — M6281 Muscle weakness (generalized): Secondary | ICD-10-CM | POA: Diagnosis not present

## 2019-09-20 DIAGNOSIS — M5416 Radiculopathy, lumbar region: Secondary | ICD-10-CM | POA: Diagnosis not present

## 2019-09-20 DIAGNOSIS — M2560 Stiffness of unspecified joint, not elsewhere classified: Secondary | ICD-10-CM | POA: Diagnosis not present

## 2019-09-26 DIAGNOSIS — M6281 Muscle weakness (generalized): Secondary | ICD-10-CM | POA: Diagnosis not present

## 2019-09-26 DIAGNOSIS — M2560 Stiffness of unspecified joint, not elsewhere classified: Secondary | ICD-10-CM | POA: Diagnosis not present

## 2019-09-26 DIAGNOSIS — M5416 Radiculopathy, lumbar region: Secondary | ICD-10-CM | POA: Diagnosis not present

## 2019-10-01 DIAGNOSIS — M5416 Radiculopathy, lumbar region: Secondary | ICD-10-CM | POA: Diagnosis not present

## 2019-10-01 DIAGNOSIS — M6281 Muscle weakness (generalized): Secondary | ICD-10-CM | POA: Diagnosis not present

## 2019-10-01 DIAGNOSIS — M2560 Stiffness of unspecified joint, not elsewhere classified: Secondary | ICD-10-CM | POA: Diagnosis not present

## 2019-10-04 DIAGNOSIS — M5416 Radiculopathy, lumbar region: Secondary | ICD-10-CM | POA: Diagnosis not present

## 2019-10-04 DIAGNOSIS — M6281 Muscle weakness (generalized): Secondary | ICD-10-CM | POA: Diagnosis not present

## 2019-10-04 DIAGNOSIS — M2560 Stiffness of unspecified joint, not elsewhere classified: Secondary | ICD-10-CM | POA: Diagnosis not present

## 2019-10-07 DIAGNOSIS — M6281 Muscle weakness (generalized): Secondary | ICD-10-CM | POA: Diagnosis not present

## 2019-10-07 DIAGNOSIS — M2560 Stiffness of unspecified joint, not elsewhere classified: Secondary | ICD-10-CM | POA: Diagnosis not present

## 2019-10-07 DIAGNOSIS — M5416 Radiculopathy, lumbar region: Secondary | ICD-10-CM | POA: Diagnosis not present

## 2019-10-10 DIAGNOSIS — M6281 Muscle weakness (generalized): Secondary | ICD-10-CM | POA: Diagnosis not present

## 2019-10-10 DIAGNOSIS — M2560 Stiffness of unspecified joint, not elsewhere classified: Secondary | ICD-10-CM | POA: Diagnosis not present

## 2019-10-10 DIAGNOSIS — M5416 Radiculopathy, lumbar region: Secondary | ICD-10-CM | POA: Diagnosis not present

## 2019-10-15 DIAGNOSIS — M6281 Muscle weakness (generalized): Secondary | ICD-10-CM | POA: Diagnosis not present

## 2019-10-15 DIAGNOSIS — M2560 Stiffness of unspecified joint, not elsewhere classified: Secondary | ICD-10-CM | POA: Diagnosis not present

## 2019-10-15 DIAGNOSIS — M5416 Radiculopathy, lumbar region: Secondary | ICD-10-CM | POA: Diagnosis not present

## 2019-11-01 DIAGNOSIS — I781 Nevus, non-neoplastic: Secondary | ICD-10-CM | POA: Diagnosis not present

## 2019-11-01 DIAGNOSIS — L578 Other skin changes due to chronic exposure to nonionizing radiation: Secondary | ICD-10-CM | POA: Diagnosis not present

## 2019-11-01 DIAGNOSIS — L570 Actinic keratosis: Secondary | ICD-10-CM | POA: Diagnosis not present

## 2019-11-01 DIAGNOSIS — L821 Other seborrheic keratosis: Secondary | ICD-10-CM | POA: Diagnosis not present

## 2019-11-06 DIAGNOSIS — M6281 Muscle weakness (generalized): Secondary | ICD-10-CM | POA: Diagnosis not present

## 2019-11-06 DIAGNOSIS — M5416 Radiculopathy, lumbar region: Secondary | ICD-10-CM | POA: Diagnosis not present

## 2019-11-06 DIAGNOSIS — M2560 Stiffness of unspecified joint, not elsewhere classified: Secondary | ICD-10-CM | POA: Diagnosis not present

## 2019-11-12 DIAGNOSIS — M5416 Radiculopathy, lumbar region: Secondary | ICD-10-CM | POA: Diagnosis not present

## 2019-11-12 DIAGNOSIS — M2560 Stiffness of unspecified joint, not elsewhere classified: Secondary | ICD-10-CM | POA: Diagnosis not present

## 2019-11-12 DIAGNOSIS — M6281 Muscle weakness (generalized): Secondary | ICD-10-CM | POA: Diagnosis not present

## 2019-11-18 DIAGNOSIS — M5416 Radiculopathy, lumbar region: Secondary | ICD-10-CM | POA: Diagnosis not present

## 2019-11-18 DIAGNOSIS — M2560 Stiffness of unspecified joint, not elsewhere classified: Secondary | ICD-10-CM | POA: Diagnosis not present

## 2019-11-18 DIAGNOSIS — M6281 Muscle weakness (generalized): Secondary | ICD-10-CM | POA: Diagnosis not present

## 2019-11-22 DIAGNOSIS — M6281 Muscle weakness (generalized): Secondary | ICD-10-CM | POA: Diagnosis not present

## 2019-11-22 DIAGNOSIS — M2560 Stiffness of unspecified joint, not elsewhere classified: Secondary | ICD-10-CM | POA: Diagnosis not present

## 2019-11-22 DIAGNOSIS — M5416 Radiculopathy, lumbar region: Secondary | ICD-10-CM | POA: Diagnosis not present

## 2019-11-25 DIAGNOSIS — M2560 Stiffness of unspecified joint, not elsewhere classified: Secondary | ICD-10-CM | POA: Diagnosis not present

## 2019-11-25 DIAGNOSIS — M5416 Radiculopathy, lumbar region: Secondary | ICD-10-CM | POA: Diagnosis not present

## 2019-11-25 DIAGNOSIS — M6281 Muscle weakness (generalized): Secondary | ICD-10-CM | POA: Diagnosis not present

## 2019-11-28 DIAGNOSIS — M6281 Muscle weakness (generalized): Secondary | ICD-10-CM | POA: Diagnosis not present

## 2019-11-28 DIAGNOSIS — M2560 Stiffness of unspecified joint, not elsewhere classified: Secondary | ICD-10-CM | POA: Diagnosis not present

## 2019-11-28 DIAGNOSIS — M5416 Radiculopathy, lumbar region: Secondary | ICD-10-CM | POA: Diagnosis not present

## 2019-12-04 DIAGNOSIS — M2560 Stiffness of unspecified joint, not elsewhere classified: Secondary | ICD-10-CM | POA: Diagnosis not present

## 2019-12-04 DIAGNOSIS — M6281 Muscle weakness (generalized): Secondary | ICD-10-CM | POA: Diagnosis not present

## 2019-12-04 DIAGNOSIS — M5416 Radiculopathy, lumbar region: Secondary | ICD-10-CM | POA: Diagnosis not present

## 2019-12-11 DIAGNOSIS — M5416 Radiculopathy, lumbar region: Secondary | ICD-10-CM | POA: Diagnosis not present

## 2019-12-11 DIAGNOSIS — M2560 Stiffness of unspecified joint, not elsewhere classified: Secondary | ICD-10-CM | POA: Diagnosis not present

## 2019-12-11 DIAGNOSIS — M6281 Muscle weakness (generalized): Secondary | ICD-10-CM | POA: Diagnosis not present

## 2019-12-18 DIAGNOSIS — M2560 Stiffness of unspecified joint, not elsewhere classified: Secondary | ICD-10-CM | POA: Diagnosis not present

## 2019-12-18 DIAGNOSIS — M6281 Muscle weakness (generalized): Secondary | ICD-10-CM | POA: Diagnosis not present

## 2019-12-18 DIAGNOSIS — M5416 Radiculopathy, lumbar region: Secondary | ICD-10-CM | POA: Diagnosis not present

## 2019-12-25 DIAGNOSIS — M6281 Muscle weakness (generalized): Secondary | ICD-10-CM | POA: Diagnosis not present

## 2019-12-25 DIAGNOSIS — M2560 Stiffness of unspecified joint, not elsewhere classified: Secondary | ICD-10-CM | POA: Diagnosis not present

## 2019-12-25 DIAGNOSIS — M5416 Radiculopathy, lumbar region: Secondary | ICD-10-CM | POA: Diagnosis not present

## 2020-01-02 DIAGNOSIS — M6281 Muscle weakness (generalized): Secondary | ICD-10-CM | POA: Diagnosis not present

## 2020-01-02 DIAGNOSIS — M2560 Stiffness of unspecified joint, not elsewhere classified: Secondary | ICD-10-CM | POA: Diagnosis not present

## 2020-01-02 DIAGNOSIS — M5416 Radiculopathy, lumbar region: Secondary | ICD-10-CM | POA: Diagnosis not present

## 2020-01-08 DIAGNOSIS — M5416 Radiculopathy, lumbar region: Secondary | ICD-10-CM | POA: Diagnosis not present

## 2020-01-08 DIAGNOSIS — M6281 Muscle weakness (generalized): Secondary | ICD-10-CM | POA: Diagnosis not present

## 2020-01-08 DIAGNOSIS — M2560 Stiffness of unspecified joint, not elsewhere classified: Secondary | ICD-10-CM | POA: Diagnosis not present

## 2020-03-09 DIAGNOSIS — Z79899 Other long term (current) drug therapy: Secondary | ICD-10-CM | POA: Diagnosis not present

## 2020-03-09 DIAGNOSIS — Z125 Encounter for screening for malignant neoplasm of prostate: Secondary | ICD-10-CM | POA: Diagnosis not present

## 2020-03-09 DIAGNOSIS — Z23 Encounter for immunization: Secondary | ICD-10-CM | POA: Diagnosis not present

## 2020-03-09 DIAGNOSIS — E039 Hypothyroidism, unspecified: Secondary | ICD-10-CM | POA: Diagnosis not present

## 2020-03-09 DIAGNOSIS — Z6826 Body mass index (BMI) 26.0-26.9, adult: Secondary | ICD-10-CM | POA: Diagnosis not present

## 2020-03-09 DIAGNOSIS — I10 Essential (primary) hypertension: Secondary | ICD-10-CM | POA: Diagnosis not present

## 2020-03-09 DIAGNOSIS — M170 Bilateral primary osteoarthritis of knee: Secondary | ICD-10-CM | POA: Diagnosis not present

## 2020-03-25 DIAGNOSIS — E875 Hyperkalemia: Secondary | ICD-10-CM | POA: Diagnosis not present

## 2020-04-02 DIAGNOSIS — H524 Presbyopia: Secondary | ICD-10-CM | POA: Diagnosis not present

## 2020-04-23 DIAGNOSIS — E039 Hypothyroidism, unspecified: Secondary | ICD-10-CM | POA: Diagnosis not present

## 2020-05-21 DIAGNOSIS — E0500 Thyrotoxicosis with diffuse goiter without thyrotoxic crisis or storm: Secondary | ICD-10-CM | POA: Diagnosis not present

## 2020-08-24 ENCOUNTER — Other Ambulatory Visit: Payer: Self-pay | Admitting: Ophthalmology

## 2020-08-24 DIAGNOSIS — H4912 Fourth [trochlear] nerve palsy, left eye: Secondary | ICD-10-CM | POA: Diagnosis not present

## 2020-09-04 ENCOUNTER — Other Ambulatory Visit: Payer: Self-pay

## 2020-09-04 ENCOUNTER — Ambulatory Visit
Admission: RE | Admit: 2020-09-04 | Discharge: 2020-09-04 | Disposition: A | Discharge status: Home / Self Care | Payer: Medicare HMO | Source: Ambulatory Visit | Attending: Ophthalmology | Admitting: Ophthalmology

## 2020-09-04 DIAGNOSIS — H532 Diplopia: Secondary | ICD-10-CM | POA: Diagnosis not present

## 2020-09-04 DIAGNOSIS — G9389 Other specified disorders of brain: Secondary | ICD-10-CM | POA: Diagnosis not present

## 2020-09-04 DIAGNOSIS — H4912 Fourth [trochlear] nerve palsy, left eye: Secondary | ICD-10-CM | POA: Diagnosis not present

## 2020-09-04 MED ORDER — GADOBUTROL 1 MMOL/ML IV SOLN
7.5000 mL | Freq: Once | INTRAVENOUS | Status: AC | PRN
  Administered 2020-09-04: 7.5 mL via INTRAVENOUS

## 2020-09-07 DIAGNOSIS — Z6826 Body mass index (BMI) 26.0-26.9, adult: Secondary | ICD-10-CM | POA: Diagnosis not present

## 2020-09-07 DIAGNOSIS — M170 Bilateral primary osteoarthritis of knee: Secondary | ICD-10-CM | POA: Diagnosis not present

## 2020-09-07 DIAGNOSIS — E039 Hypothyroidism, unspecified: Secondary | ICD-10-CM | POA: Diagnosis not present

## 2020-09-07 DIAGNOSIS — Z23 Encounter for immunization: Secondary | ICD-10-CM | POA: Diagnosis not present

## 2020-09-07 DIAGNOSIS — I10 Essential (primary) hypertension: Secondary | ICD-10-CM | POA: Diagnosis not present

## 2020-09-07 DIAGNOSIS — Z139 Encounter for screening, unspecified: Secondary | ICD-10-CM | POA: Diagnosis not present

## 2020-09-07 DIAGNOSIS — Z9181 History of falling: Secondary | ICD-10-CM | POA: Diagnosis not present

## 2020-09-07 DIAGNOSIS — Z79899 Other long term (current) drug therapy: Secondary | ICD-10-CM | POA: Diagnosis not present

## 2020-09-07 DIAGNOSIS — Z1331 Encounter for screening for depression: Secondary | ICD-10-CM | POA: Diagnosis not present

## 2020-09-18 ENCOUNTER — Other Ambulatory Visit: Payer: Self-pay

## 2020-09-18 ENCOUNTER — Other Ambulatory Visit
Admission: RE | Admit: 2020-09-18 | Discharge: 2020-09-18 | Disposition: A | Discharge status: Home / Self Care | Payer: Medicare HMO | Attending: Ophthalmology | Admitting: Ophthalmology

## 2020-09-18 DIAGNOSIS — H4920 Sixth [abducent] nerve palsy, unspecified eye: Secondary | ICD-10-CM | POA: Insufficient documentation

## 2020-09-18 DIAGNOSIS — H4912 Fourth [trochlear] nerve palsy, left eye: Secondary | ICD-10-CM | POA: Diagnosis not present

## 2020-09-18 DIAGNOSIS — H532 Diplopia: Secondary | ICD-10-CM | POA: Insufficient documentation

## 2020-09-18 DIAGNOSIS — Z0100 Encounter for examination of eyes and vision without abnormal findings: Secondary | ICD-10-CM | POA: Diagnosis not present

## 2020-09-18 LAB — CBC WITH DIFFERENTIAL/PLATELET
Abs Immature Granulocytes: 0.03 10*3/uL (ref 0.00–0.07)
Basophils Absolute: 0 10*3/uL (ref 0.0–0.1)
Basophils Relative: 1 %
Eosinophils Absolute: 0.2 10*3/uL (ref 0.0–0.5)
Eosinophils Relative: 2 %
HCT: 45 % (ref 39.0–52.0)
Hemoglobin: 14.8 g/dL (ref 13.0–17.0)
Immature Granulocytes: 1 %
Lymphocytes Relative: 27 %
Lymphs Abs: 1.8 10*3/uL (ref 0.7–4.0)
MCH: 30.3 pg (ref 26.0–34.0)
MCHC: 32.9 g/dL (ref 30.0–36.0)
MCV: 92 fL (ref 80.0–100.0)
Monocytes Absolute: 0.7 10*3/uL (ref 0.1–1.0)
Monocytes Relative: 10 %
Neutro Abs: 3.9 10*3/uL (ref 1.7–7.7)
Neutrophils Relative %: 59 %
Platelets: 179 10*3/uL (ref 150–400)
RBC: 4.89 MIL/uL (ref 4.22–5.81)
RDW: 13.2 % (ref 11.5–15.5)
WBC: 6.6 10*3/uL (ref 4.0–10.5)
nRBC: 0 % (ref 0.0–0.2)

## 2020-09-22 LAB — ACETYLCHOLINE RECEPTOR AB, ALL
Acety choline binding ab: <0.03 nmol/L (ref 0.00–0.24)
Acetylchol Block Ab: 20 % (ref 0–25)

## 2020-10-21 DIAGNOSIS — H532 Diplopia: Secondary | ICD-10-CM | POA: Diagnosis not present

## 2020-12-05 DIAGNOSIS — L821 Other seborrheic keratosis: Secondary | ICD-10-CM | POA: Diagnosis not present

## 2020-12-05 DIAGNOSIS — D1801 Hemangioma of skin and subcutaneous tissue: Secondary | ICD-10-CM | POA: Diagnosis not present

## 2020-12-05 DIAGNOSIS — L578 Other skin changes due to chronic exposure to nonionizing radiation: Secondary | ICD-10-CM | POA: Diagnosis not present

## 2020-12-05 DIAGNOSIS — L570 Actinic keratosis: Secondary | ICD-10-CM | POA: Diagnosis not present

## 2021-02-18 DIAGNOSIS — H4912 Fourth [trochlear] nerve palsy, left eye: Secondary | ICD-10-CM | POA: Diagnosis not present

## 2021-02-18 DIAGNOSIS — Z0100 Encounter for examination of eyes and vision without abnormal findings: Secondary | ICD-10-CM | POA: Diagnosis not present

## 2021-03-12 DIAGNOSIS — M170 Bilateral primary osteoarthritis of knee: Secondary | ICD-10-CM | POA: Diagnosis not present

## 2021-03-12 DIAGNOSIS — Z23 Encounter for immunization: Secondary | ICD-10-CM | POA: Diagnosis not present

## 2021-03-12 DIAGNOSIS — Z6826 Body mass index (BMI) 26.0-26.9, adult: Secondary | ICD-10-CM | POA: Diagnosis not present

## 2021-03-12 DIAGNOSIS — Z125 Encounter for screening for malignant neoplasm of prostate: Secondary | ICD-10-CM | POA: Diagnosis not present

## 2021-03-12 DIAGNOSIS — Z79899 Other long term (current) drug therapy: Secondary | ICD-10-CM | POA: Diagnosis not present

## 2021-03-12 DIAGNOSIS — E039 Hypothyroidism, unspecified: Secondary | ICD-10-CM | POA: Diagnosis not present

## 2021-03-12 DIAGNOSIS — I10 Essential (primary) hypertension: Secondary | ICD-10-CM | POA: Diagnosis not present

## 2021-04-01 DIAGNOSIS — H4912 Fourth [trochlear] nerve palsy, left eye: Secondary | ICD-10-CM | POA: Diagnosis not present

## 2021-04-29 DIAGNOSIS — E0500 Thyrotoxicosis with diffuse goiter without thyrotoxic crisis or storm: Secondary | ICD-10-CM | POA: Diagnosis not present

## 2021-05-19 DIAGNOSIS — H4912 Fourth [trochlear] nerve palsy, left eye: Secondary | ICD-10-CM | POA: Diagnosis not present

## 2021-06-24 DIAGNOSIS — E0500 Thyrotoxicosis with diffuse goiter without thyrotoxic crisis or storm: Secondary | ICD-10-CM | POA: Diagnosis not present

## 2021-07-26 DIAGNOSIS — H4912 Fourth [trochlear] nerve palsy, left eye: Secondary | ICD-10-CM | POA: Diagnosis not present

## 2021-08-25 IMAGING — MR MR HEAD WO/W CM
13 series · 48 of 48 positions shown · IV contrast (7.5ml Gadavist)
Comparison: CT head 04/02/2014

CLINICAL DATA: Double vision 10 months.

EXAM:
MRI HEAD WITHOUT AND WITH CONTRAST
TECHNIQUE: Multiplanar, multiecho pulse sequences of the brain and surrounding
structures were obtained without and with intravenous contrast.
CONTRAST:  7.5mL GADAVIST GADOBUTROL 1 MMOL/ML IV SOLN

[Series 5: ax dwi_tracew · axial · 3.0mm · 0.65mm/px · z∈[-97,+58]mm · 4 of 48 slices shown]
[im 1/48]
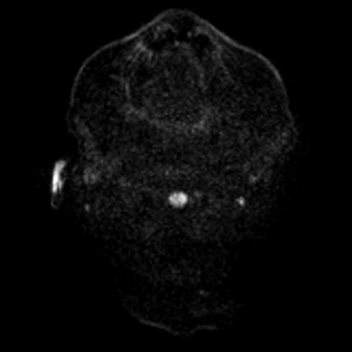
[im 16/48]
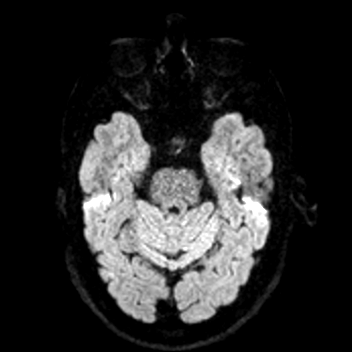
[im 32/48]
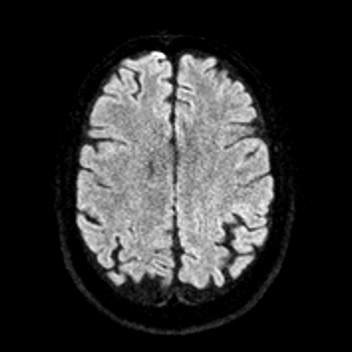
[im 48/48]
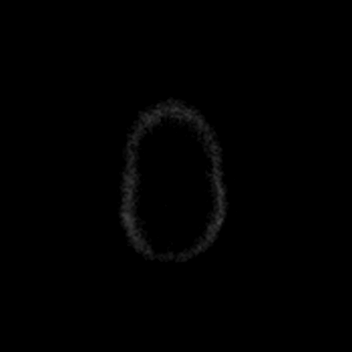

[Series 6: ax dwi_adc · axial · 3.0mm · 0.65mm/px · z∈[-97,+58]mm · 3 of 48 slices shown]
[im 1/48]
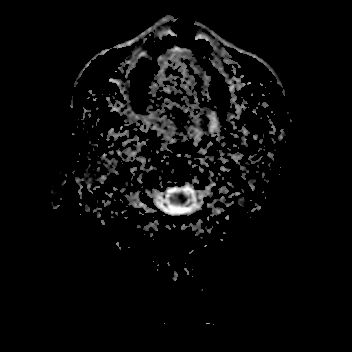
[im 24/48]
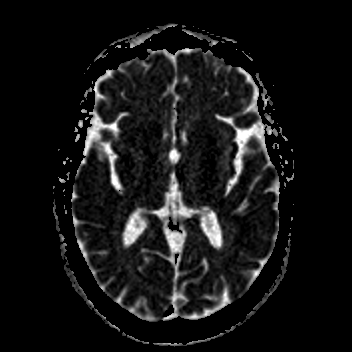
[im 48/48]
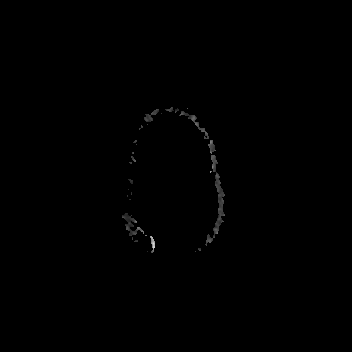

[Series 7: cor dwi_tracew · coronal · 5.0mm · 0.68mm/px · 2 of 40 slices shown]
[im 1/40]
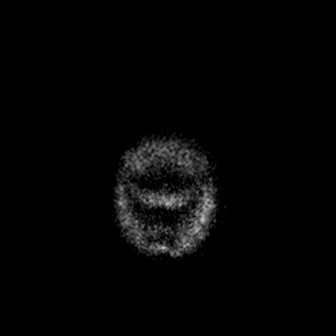
[im 40/40]
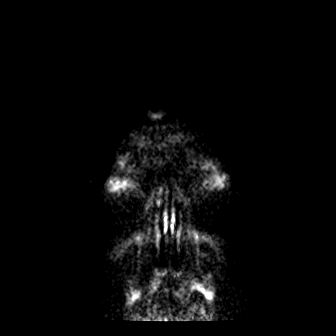

[Series 8: cor dwi_adc · coronal · 5.0mm · 0.68mm/px · 2 of 40 slices shown]
[im 1/40]
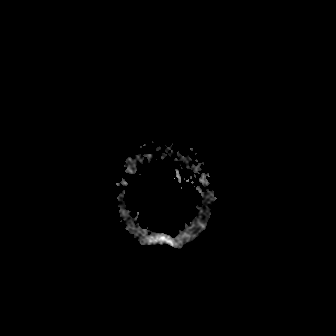
[im 40/40]
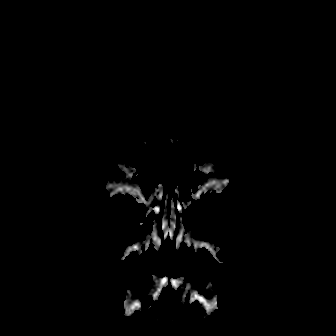

[Series 9: T1 · sagittal · 5.0mm · 0.62mm/px · 1 of 23 slices shown (1 of 2)]
[im 1/23]
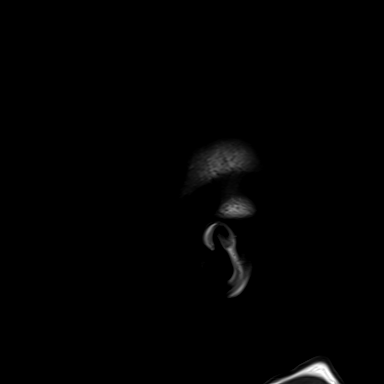

[Series 10: T2 · axial · 5.0mm · 0.53mm/px · z∈[-93,+63]mm · 2 of 27 slices shown]
[im 1/27]
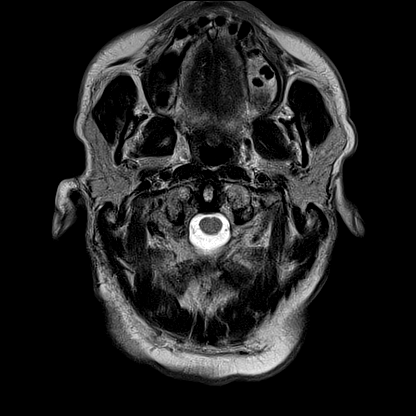
[im 27/27]
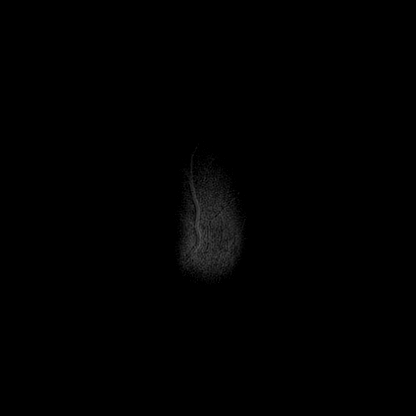

[Series 12: pha_images · axial · 3.0mm · 0.90mm/px · z∈[-103,+70]mm · 3 of 59 slices shown]
[im 1/59]
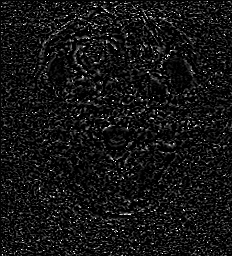
[im 30/59]
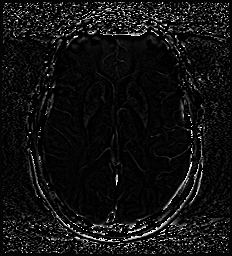
[im 59/59]
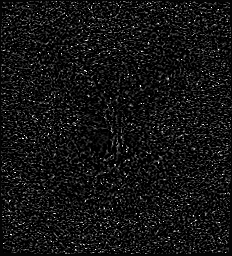

[Series 13: swi_images · axial · 3.0mm · 0.90mm/px · z∈[-103,+73]mm · 4 of 60 slices shown]
[im 1/60]
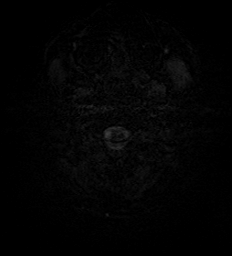
[im 20/60]
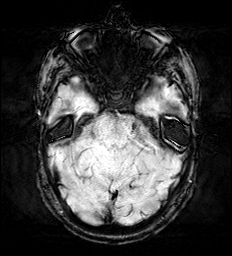
[im 40/60]
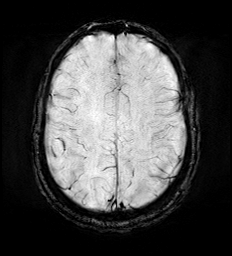
[im 60/60]
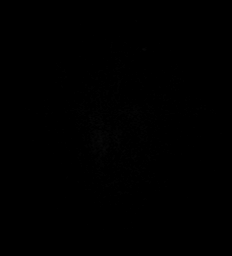

[Series 15: FLAIR · axial · 3.0mm · 0.53mm/px · z∈[-96,+66]mm · 3 of 55 slices shown]
[im 1/55]
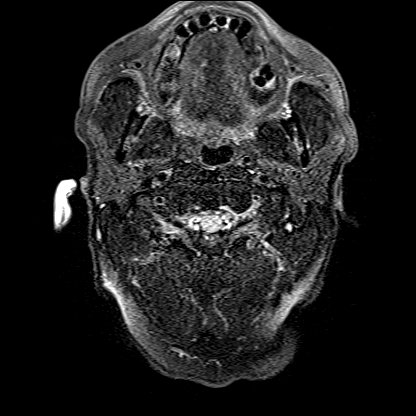
[im 28/55]
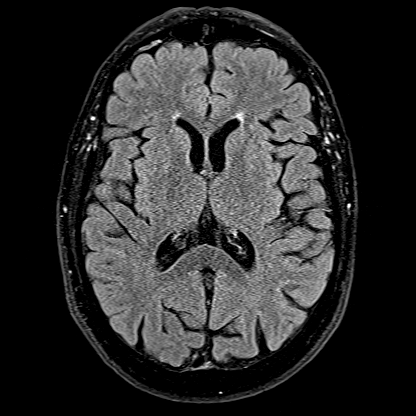
[im 55/55]
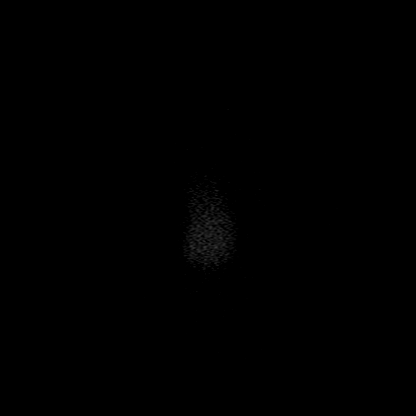

[Series 16: T1 · axial · 1.0mm · 0.98mm/px · z∈[-99,+76]mm · 10 of 176 slices shown (2 of 2)]
[im 1/176]
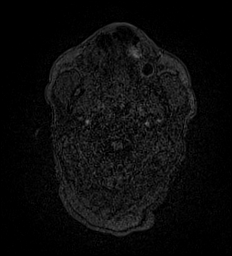
[im 20/176]
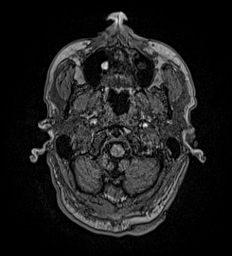
[im 39/176]
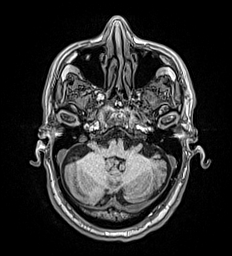
[im 59/176]
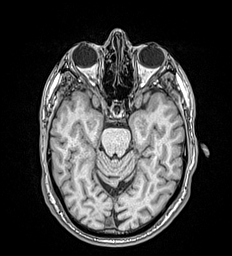
[im 78/176]
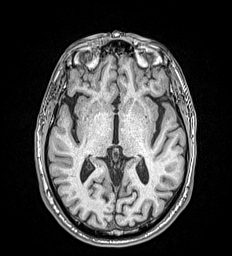
[im 98/176]
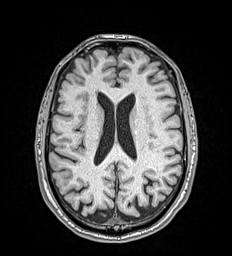
[im 117/176]
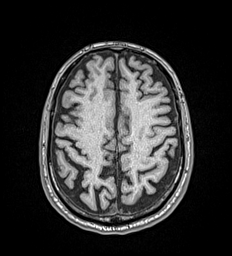
[im 137/176]
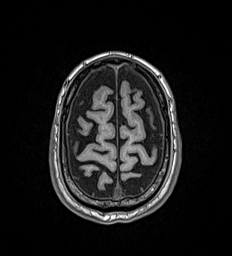
[im 156/176]
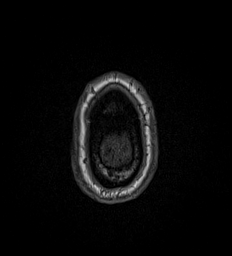
[im 176/176]
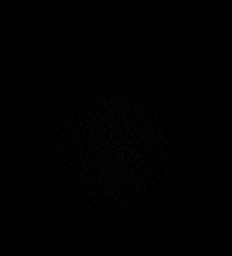

[Series 17: T2 post-contrast · coronal · 5.0mm · 0.57mm/px · 2 of 31 slices shown]
[im 1/31]
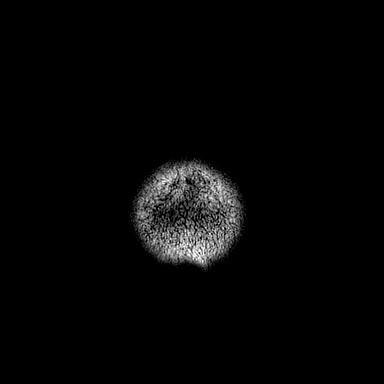
[im 31/31]
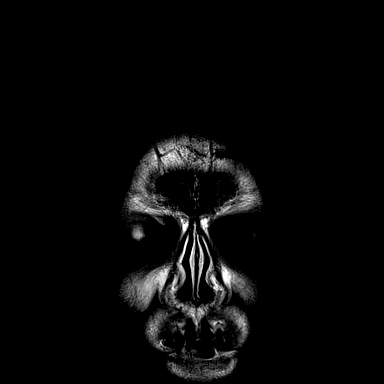

[Series 18: T1 post-contrast · axial · 1.0mm · 0.98mm/px · z∈[-99,+76]mm · 10 of 176 slices shown (1 of 2)]
[im 1/176]
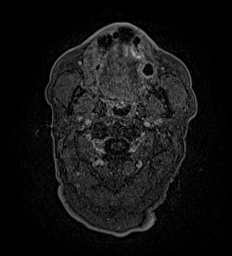
[im 20/176]
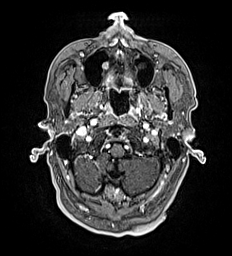
[im 39/176]
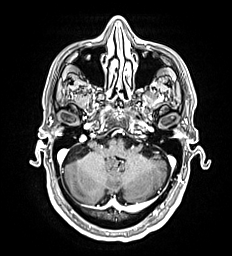
[im 59/176]
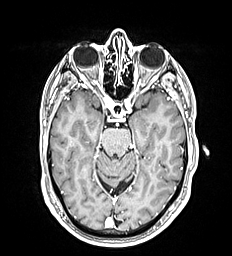
[im 78/176]
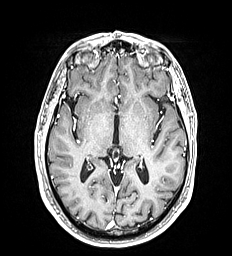
[im 98/176]
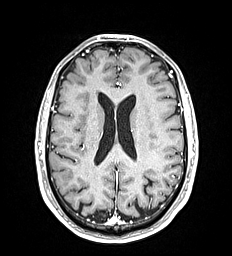
[im 117/176]
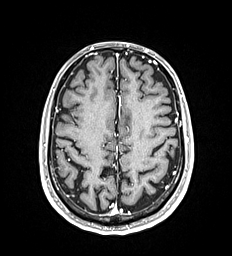
[im 137/176]
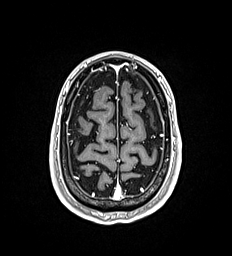
[im 156/176]
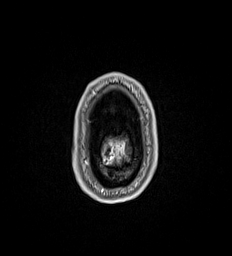
[im 176/176]
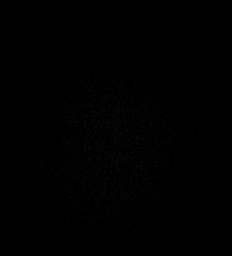

[Series 19: T1 post-contrast · coronal · 5.0mm · 0.57mm/px · 2 of 31 slices shown (2 of 2)]
[im 1/31]
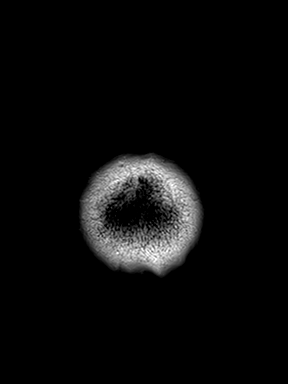
[im 31/31]
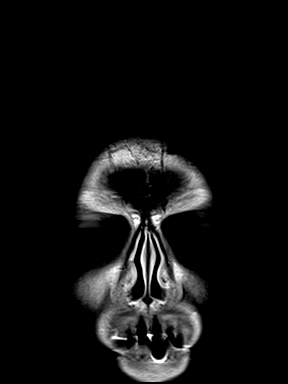

[48 of 48 positions shown; findings below may reference images not displayed]

FINDINGS: Brain: Ventricle size and cerebral volume normal for age. Few small
deep white matter and subcortical hyperintensities bilaterally.
Negative for acute infarct.

Enhancing lesion to the left of the pons in the subarachnoid space
measuring approximately 3 x 8 mm. This has irregular morphology and
appears to connect with small vessels. Probable small vascular
malformation. No mass-effect on the pons. Otherwise normal
enhancement.

Vascular: Normal arterial flow voids.

Skull and upper cervical spine: No focal skeletal lesion.

Sinuses/Orbits: Minimal mucosal edema paranasal sinuses. Bilateral
cataract extraction

Other: None
IMPRESSION: Negative for acute infarct. Mild white matter changes consistent
with chronic microvascular ischemia

3 x 8 mm area of serpiginous enhancement in the subarachnoid space
left pons, favor small vascular malformation such as a varix.

## 2021-09-07 DIAGNOSIS — I10 Essential (primary) hypertension: Secondary | ICD-10-CM | POA: Diagnosis not present

## 2021-09-07 DIAGNOSIS — M170 Bilateral primary osteoarthritis of knee: Secondary | ICD-10-CM | POA: Diagnosis not present

## 2021-09-07 DIAGNOSIS — Z6826 Body mass index (BMI) 26.0-26.9, adult: Secondary | ICD-10-CM | POA: Diagnosis not present

## 2021-09-07 DIAGNOSIS — E039 Hypothyroidism, unspecified: Secondary | ICD-10-CM | POA: Diagnosis not present

## 2021-09-07 DIAGNOSIS — Z79899 Other long term (current) drug therapy: Secondary | ICD-10-CM | POA: Diagnosis not present

## 2021-09-14 DIAGNOSIS — C44529 Squamous cell carcinoma of skin of other part of trunk: Secondary | ICD-10-CM | POA: Diagnosis not present

## 2021-09-14 DIAGNOSIS — L578 Other skin changes due to chronic exposure to nonionizing radiation: Secondary | ICD-10-CM | POA: Diagnosis not present

## 2021-09-14 DIAGNOSIS — L821 Other seborrheic keratosis: Secondary | ICD-10-CM | POA: Diagnosis not present

## 2021-09-23 DIAGNOSIS — C44529 Squamous cell carcinoma of skin of other part of trunk: Secondary | ICD-10-CM | POA: Diagnosis not present

## 2021-10-11 DIAGNOSIS — H4912 Fourth [trochlear] nerve palsy, left eye: Secondary | ICD-10-CM | POA: Diagnosis not present

## 2022-01-17 DIAGNOSIS — I83819 Varicose veins of unspecified lower extremities with pain: Secondary | ICD-10-CM | POA: Diagnosis not present

## 2022-01-19 DIAGNOSIS — D485 Neoplasm of uncertain behavior of skin: Secondary | ICD-10-CM | POA: Diagnosis not present

## 2022-03-03 DIAGNOSIS — C44619 Basal cell carcinoma of skin of left upper limb, including shoulder: Secondary | ICD-10-CM | POA: Diagnosis not present

## 2022-03-15 DIAGNOSIS — Z23 Encounter for immunization: Secondary | ICD-10-CM | POA: Diagnosis not present

## 2022-03-15 DIAGNOSIS — R609 Edema, unspecified: Secondary | ICD-10-CM | POA: Diagnosis not present

## 2022-03-15 DIAGNOSIS — Z79899 Other long term (current) drug therapy: Secondary | ICD-10-CM | POA: Diagnosis not present

## 2022-03-15 DIAGNOSIS — I10 Essential (primary) hypertension: Secondary | ICD-10-CM | POA: Diagnosis not present

## 2022-03-15 DIAGNOSIS — Z139 Encounter for screening, unspecified: Secondary | ICD-10-CM | POA: Diagnosis not present

## 2022-03-15 DIAGNOSIS — E039 Hypothyroidism, unspecified: Secondary | ICD-10-CM | POA: Diagnosis not present

## 2022-03-15 DIAGNOSIS — Z9181 History of falling: Secondary | ICD-10-CM | POA: Diagnosis not present

## 2022-03-15 DIAGNOSIS — M170 Bilateral primary osteoarthritis of knee: Secondary | ICD-10-CM | POA: Diagnosis not present

## 2022-03-17 DIAGNOSIS — C44529 Squamous cell carcinoma of skin of other part of trunk: Secondary | ICD-10-CM | POA: Diagnosis not present

## 2022-03-17 DIAGNOSIS — C44619 Basal cell carcinoma of skin of left upper limb, including shoulder: Secondary | ICD-10-CM | POA: Diagnosis not present

## 2022-03-30 DIAGNOSIS — H35373 Puckering of macula, bilateral: Secondary | ICD-10-CM | POA: Diagnosis not present

## 2022-05-30 DIAGNOSIS — R059 Cough, unspecified: Secondary | ICD-10-CM | POA: Diagnosis not present

## 2022-05-30 DIAGNOSIS — I10 Essential (primary) hypertension: Secondary | ICD-10-CM | POA: Diagnosis not present

## 2022-06-27 DIAGNOSIS — R229 Localized swelling, mass and lump, unspecified: Secondary | ICD-10-CM | POA: Diagnosis not present

## 2022-09-14 DIAGNOSIS — E039 Hypothyroidism, unspecified: Secondary | ICD-10-CM | POA: Diagnosis not present

## 2022-09-14 DIAGNOSIS — G629 Polyneuropathy, unspecified: Secondary | ICD-10-CM | POA: Diagnosis not present

## 2022-09-14 DIAGNOSIS — I10 Essential (primary) hypertension: Secondary | ICD-10-CM | POA: Diagnosis not present

## 2022-09-14 DIAGNOSIS — Z1331 Encounter for screening for depression: Secondary | ICD-10-CM | POA: Diagnosis not present

## 2022-09-14 DIAGNOSIS — M170 Bilateral primary osteoarthritis of knee: Secondary | ICD-10-CM | POA: Diagnosis not present

## 2022-09-14 DIAGNOSIS — Z125 Encounter for screening for malignant neoplasm of prostate: Secondary | ICD-10-CM | POA: Diagnosis not present

## 2022-09-14 DIAGNOSIS — Z79899 Other long term (current) drug therapy: Secondary | ICD-10-CM | POA: Diagnosis not present

## 2022-09-14 DIAGNOSIS — R600 Localized edema: Secondary | ICD-10-CM | POA: Diagnosis not present

## 2022-09-22 DIAGNOSIS — L820 Inflamed seborrheic keratosis: Secondary | ICD-10-CM | POA: Diagnosis not present

## 2022-09-22 DIAGNOSIS — L821 Other seborrheic keratosis: Secondary | ICD-10-CM | POA: Diagnosis not present

## 2022-09-22 DIAGNOSIS — L578 Other skin changes due to chronic exposure to nonionizing radiation: Secondary | ICD-10-CM | POA: Diagnosis not present

## 2022-09-22 DIAGNOSIS — C44619 Basal cell carcinoma of skin of left upper limb, including shoulder: Secondary | ICD-10-CM | POA: Diagnosis not present

## 2022-09-22 DIAGNOSIS — L570 Actinic keratosis: Secondary | ICD-10-CM | POA: Diagnosis not present

## 2022-09-26 DIAGNOSIS — H5022 Vertical strabismus, left eye: Secondary | ICD-10-CM | POA: Diagnosis not present

## 2022-09-26 DIAGNOSIS — H532 Diplopia: Secondary | ICD-10-CM | POA: Diagnosis not present

## 2022-09-26 DIAGNOSIS — H35379 Puckering of macula, unspecified eye: Secondary | ICD-10-CM | POA: Diagnosis not present

## 2022-09-26 DIAGNOSIS — H43813 Vitreous degeneration, bilateral: Secondary | ICD-10-CM | POA: Diagnosis not present

## 2022-10-11 DIAGNOSIS — R7309 Other abnormal glucose: Secondary | ICD-10-CM | POA: Diagnosis not present

## 2022-10-11 DIAGNOSIS — G629 Polyneuropathy, unspecified: Secondary | ICD-10-CM | POA: Diagnosis not present

## 2022-10-12 DIAGNOSIS — R200 Anesthesia of skin: Secondary | ICD-10-CM | POA: Diagnosis not present

## 2022-10-12 DIAGNOSIS — R202 Paresthesia of skin: Secondary | ICD-10-CM | POA: Diagnosis not present

## 2022-11-01 DIAGNOSIS — E039 Hypothyroidism, unspecified: Secondary | ICD-10-CM | POA: Diagnosis not present

## 2023-01-26 DIAGNOSIS — Z1331 Encounter for screening for depression: Secondary | ICD-10-CM | POA: Diagnosis not present

## 2023-01-26 DIAGNOSIS — Z0000 Encounter for general adult medical examination without abnormal findings: Secondary | ICD-10-CM | POA: Diagnosis not present

## 2023-01-26 DIAGNOSIS — Z9181 History of falling: Secondary | ICD-10-CM | POA: Diagnosis not present

## 2023-02-21 DIAGNOSIS — Z23 Encounter for immunization: Secondary | ICD-10-CM | POA: Diagnosis not present

## 2023-02-21 DIAGNOSIS — G629 Polyneuropathy, unspecified: Secondary | ICD-10-CM | POA: Diagnosis not present

## 2023-03-17 DIAGNOSIS — Z139 Encounter for screening, unspecified: Secondary | ICD-10-CM | POA: Diagnosis not present

## 2023-03-17 DIAGNOSIS — R7303 Prediabetes: Secondary | ICD-10-CM | POA: Diagnosis not present

## 2023-03-17 DIAGNOSIS — G629 Polyneuropathy, unspecified: Secondary | ICD-10-CM | POA: Diagnosis not present

## 2023-03-17 DIAGNOSIS — I10 Essential (primary) hypertension: Secondary | ICD-10-CM | POA: Diagnosis not present

## 2023-03-17 DIAGNOSIS — Z79899 Other long term (current) drug therapy: Secondary | ICD-10-CM | POA: Diagnosis not present

## 2023-03-17 DIAGNOSIS — E039 Hypothyroidism, unspecified: Secondary | ICD-10-CM | POA: Diagnosis not present

## 2023-03-17 DIAGNOSIS — M170 Bilateral primary osteoarthritis of knee: Secondary | ICD-10-CM | POA: Diagnosis not present

## 2023-03-17 DIAGNOSIS — Z23 Encounter for immunization: Secondary | ICD-10-CM | POA: Diagnosis not present

## 2023-03-17 DIAGNOSIS — R600 Localized edema: Secondary | ICD-10-CM | POA: Diagnosis not present

## 2023-03-29 DIAGNOSIS — R42 Dizziness and giddiness: Secondary | ICD-10-CM | POA: Diagnosis not present

## 2023-05-17 DIAGNOSIS — M170 Bilateral primary osteoarthritis of knee: Secondary | ICD-10-CM | POA: Diagnosis not present

## 2023-05-17 DIAGNOSIS — S8392XA Sprain of unspecified site of left knee, initial encounter: Secondary | ICD-10-CM | POA: Diagnosis not present

## 2023-05-24 DIAGNOSIS — L814 Other melanin hyperpigmentation: Secondary | ICD-10-CM | POA: Diagnosis not present

## 2023-05-24 DIAGNOSIS — D225 Melanocytic nevi of trunk: Secondary | ICD-10-CM | POA: Diagnosis not present

## 2023-05-24 DIAGNOSIS — D485 Neoplasm of uncertain behavior of skin: Secondary | ICD-10-CM | POA: Diagnosis not present

## 2023-05-24 DIAGNOSIS — L821 Other seborrheic keratosis: Secondary | ICD-10-CM | POA: Diagnosis not present

## 2023-05-24 DIAGNOSIS — L570 Actinic keratosis: Secondary | ICD-10-CM | POA: Diagnosis not present

## 2023-05-24 DIAGNOSIS — D2239 Melanocytic nevi of other parts of face: Secondary | ICD-10-CM | POA: Diagnosis not present

## 2023-06-30 DIAGNOSIS — Z6825 Body mass index (BMI) 25.0-25.9, adult: Secondary | ICD-10-CM | POA: Diagnosis not present

## 2023-06-30 DIAGNOSIS — E663 Overweight: Secondary | ICD-10-CM | POA: Diagnosis not present

## 2023-06-30 DIAGNOSIS — R7303 Prediabetes: Secondary | ICD-10-CM | POA: Diagnosis not present

## 2023-06-30 DIAGNOSIS — I10 Essential (primary) hypertension: Secondary | ICD-10-CM | POA: Diagnosis not present

## 2023-06-30 DIAGNOSIS — E039 Hypothyroidism, unspecified: Secondary | ICD-10-CM | POA: Diagnosis not present

## 2023-06-30 DIAGNOSIS — Z008 Encounter for other general examination: Secondary | ICD-10-CM | POA: Diagnosis not present

## 2023-06-30 DIAGNOSIS — M1990 Unspecified osteoarthritis, unspecified site: Secondary | ICD-10-CM | POA: Diagnosis not present

## 2023-08-25 DIAGNOSIS — H35379 Puckering of macula, unspecified eye: Secondary | ICD-10-CM | POA: Diagnosis not present

## 2023-08-25 DIAGNOSIS — H5022 Vertical strabismus, left eye: Secondary | ICD-10-CM | POA: Diagnosis not present

## 2023-08-25 DIAGNOSIS — H35373 Puckering of macula, bilateral: Secondary | ICD-10-CM | POA: Diagnosis not present

## 2023-08-25 DIAGNOSIS — H43813 Vitreous degeneration, bilateral: Secondary | ICD-10-CM | POA: Diagnosis not present

## 2023-09-19 DIAGNOSIS — Z79899 Other long term (current) drug therapy: Secondary | ICD-10-CM | POA: Diagnosis not present

## 2023-09-19 DIAGNOSIS — E039 Hypothyroidism, unspecified: Secondary | ICD-10-CM | POA: Diagnosis not present

## 2023-09-19 DIAGNOSIS — I10 Essential (primary) hypertension: Secondary | ICD-10-CM | POA: Diagnosis not present

## 2023-09-19 DIAGNOSIS — Z23 Encounter for immunization: Secondary | ICD-10-CM | POA: Diagnosis not present

## 2023-09-19 DIAGNOSIS — G629 Polyneuropathy, unspecified: Secondary | ICD-10-CM | POA: Diagnosis not present

## 2023-09-19 DIAGNOSIS — R600 Localized edema: Secondary | ICD-10-CM | POA: Diagnosis not present

## 2023-09-19 DIAGNOSIS — M170 Bilateral primary osteoarthritis of knee: Secondary | ICD-10-CM | POA: Diagnosis not present

## 2023-09-19 DIAGNOSIS — R7303 Prediabetes: Secondary | ICD-10-CM | POA: Diagnosis not present

## 2023-12-14 DIAGNOSIS — M5432 Sciatica, left side: Secondary | ICD-10-CM | POA: Diagnosis not present

## 2023-12-14 DIAGNOSIS — R2689 Other abnormalities of gait and mobility: Secondary | ICD-10-CM | POA: Diagnosis not present

## 2023-12-14 DIAGNOSIS — M2560 Stiffness of unspecified joint, not elsewhere classified: Secondary | ICD-10-CM | POA: Diagnosis not present

## 2023-12-19 DIAGNOSIS — M2560 Stiffness of unspecified joint, not elsewhere classified: Secondary | ICD-10-CM | POA: Diagnosis not present

## 2023-12-19 DIAGNOSIS — R2689 Other abnormalities of gait and mobility: Secondary | ICD-10-CM | POA: Diagnosis not present

## 2023-12-19 DIAGNOSIS — M5432 Sciatica, left side: Secondary | ICD-10-CM | POA: Diagnosis not present

## 2024-01-01 DIAGNOSIS — R2689 Other abnormalities of gait and mobility: Secondary | ICD-10-CM | POA: Diagnosis not present

## 2024-01-01 DIAGNOSIS — M5432 Sciatica, left side: Secondary | ICD-10-CM | POA: Diagnosis not present

## 2024-01-01 DIAGNOSIS — M2560 Stiffness of unspecified joint, not elsewhere classified: Secondary | ICD-10-CM | POA: Diagnosis not present

## 2024-01-15 DIAGNOSIS — M2560 Stiffness of unspecified joint, not elsewhere classified: Secondary | ICD-10-CM | POA: Diagnosis not present

## 2024-01-15 DIAGNOSIS — R2689 Other abnormalities of gait and mobility: Secondary | ICD-10-CM | POA: Diagnosis not present

## 2024-01-15 DIAGNOSIS — M5432 Sciatica, left side: Secondary | ICD-10-CM | POA: Diagnosis not present

## 2024-02-21 DIAGNOSIS — Z1331 Encounter for screening for depression: Secondary | ICD-10-CM | POA: Diagnosis not present

## 2024-02-21 DIAGNOSIS — G629 Polyneuropathy, unspecified: Secondary | ICD-10-CM | POA: Diagnosis not present

## 2024-03-26 DIAGNOSIS — R600 Localized edema: Secondary | ICD-10-CM | POA: Diagnosis not present

## 2024-03-26 DIAGNOSIS — Z79899 Other long term (current) drug therapy: Secondary | ICD-10-CM | POA: Diagnosis not present

## 2024-03-26 DIAGNOSIS — R7303 Prediabetes: Secondary | ICD-10-CM | POA: Diagnosis not present

## 2024-03-26 DIAGNOSIS — Z125 Encounter for screening for malignant neoplasm of prostate: Secondary | ICD-10-CM | POA: Diagnosis not present

## 2024-03-26 DIAGNOSIS — Z23 Encounter for immunization: Secondary | ICD-10-CM | POA: Diagnosis not present

## 2024-03-26 DIAGNOSIS — I10 Essential (primary) hypertension: Secondary | ICD-10-CM | POA: Diagnosis not present

## 2024-03-26 DIAGNOSIS — E039 Hypothyroidism, unspecified: Secondary | ICD-10-CM | POA: Diagnosis not present

## 2024-03-26 DIAGNOSIS — G629 Polyneuropathy, unspecified: Secondary | ICD-10-CM | POA: Diagnosis not present

## 2024-03-26 DIAGNOSIS — M170 Bilateral primary osteoarthritis of knee: Secondary | ICD-10-CM | POA: Diagnosis not present
